# Patient Record
Sex: Female | Born: 1980 | Race: White | Hispanic: No | Marital: Married | Smoking: Never smoker
Health system: Southern US, Community
[De-identification: ages and names within clinical notes are randomized; demographics above are authoritative.]

## PROBLEM LIST (undated history)

## (undated) DIAGNOSIS — C50919 Malignant neoplasm of unspecified site of unspecified female breast: Secondary | ICD-10-CM

## (undated) HISTORY — PX: MASTECTOMY: SHX3

## (undated) HISTORY — PX: ABDOMINAL HYSTERECTOMY: SHX81

## (undated) HISTORY — PX: TONSILLECTOMY: SUR1361

---

## 2004-04-07 ENCOUNTER — Emergency Department: Payer: Self-pay | Admitting: Emergency Medicine

## 2005-03-11 ENCOUNTER — Emergency Department: Payer: Self-pay | Admitting: Emergency Medicine

## 2005-03-15 ENCOUNTER — Ambulatory Visit: Payer: Self-pay | Admitting: Emergency Medicine

## 2005-11-13 ENCOUNTER — Emergency Department: Payer: Self-pay | Admitting: Emergency Medicine

## 2006-01-16 ENCOUNTER — Emergency Department: Payer: Self-pay

## 2006-01-18 ENCOUNTER — Ambulatory Visit: Payer: Self-pay

## 2006-03-30 ENCOUNTER — Emergency Department: Payer: Self-pay | Admitting: Emergency Medicine

## 2007-02-24 ENCOUNTER — Emergency Department: Payer: Self-pay | Admitting: Emergency Medicine

## 2007-10-04 ENCOUNTER — Ambulatory Visit: Payer: Self-pay | Admitting: Obstetrics and Gynecology

## 2007-12-23 ENCOUNTER — Emergency Department: Payer: Self-pay | Admitting: Unknown Physician Specialty

## 2008-03-19 ENCOUNTER — Ambulatory Visit: Payer: Self-pay | Admitting: Obstetrics and Gynecology

## 2008-06-14 ENCOUNTER — Emergency Department: Payer: Self-pay | Admitting: Emergency Medicine

## 2008-11-25 ENCOUNTER — Ambulatory Visit: Payer: Self-pay | Admitting: Gastroenterology

## 2008-12-11 ENCOUNTER — Emergency Department: Payer: Self-pay | Admitting: Emergency Medicine

## 2009-03-31 ENCOUNTER — Emergency Department: Payer: Self-pay

## 2009-12-16 ENCOUNTER — Encounter: Payer: Self-pay | Admitting: Maternal & Fetal Medicine

## 2010-02-24 ENCOUNTER — Observation Stay: Payer: Self-pay | Admitting: Obstetrics and Gynecology

## 2010-03-23 ENCOUNTER — Observation Stay: Payer: Self-pay

## 2010-05-13 ENCOUNTER — Other Ambulatory Visit: Payer: Self-pay | Admitting: Obstetrics and Gynecology

## 2010-05-20 ENCOUNTER — Ambulatory Visit: Payer: Self-pay | Admitting: Obstetrics and Gynecology

## 2010-05-20 IMAGING — US ABDOMEN ULTRASOUND LIMITED
1 series · 17 of 25 positions shown · non-contrast
Comparison: none

REASON FOR EXAM: ATTN Gallbladder  back pain RUQ  worse after eating  pt
is [N4] preg
COMMENTS:

[Series 1: abdomen ultrasound limited · 17 of 75 slices shown]
[im 1/75]
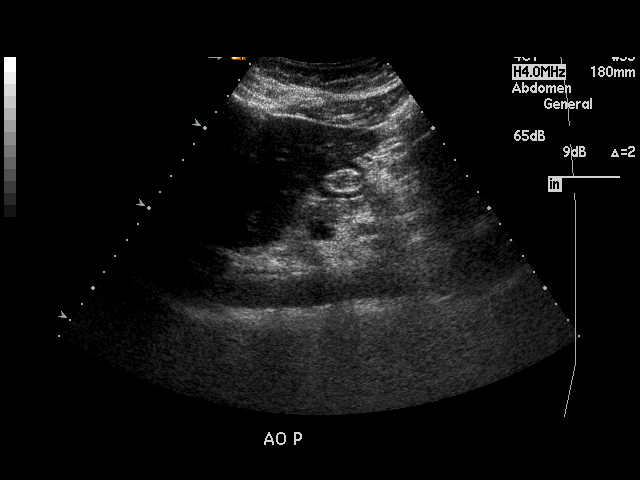
[im 7/75]
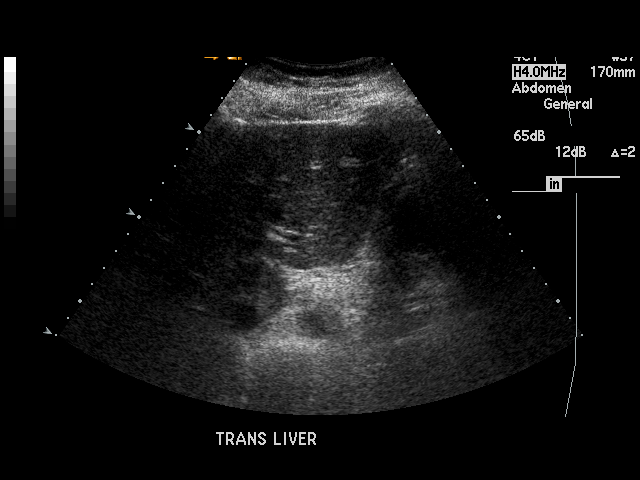
[im 10/75]
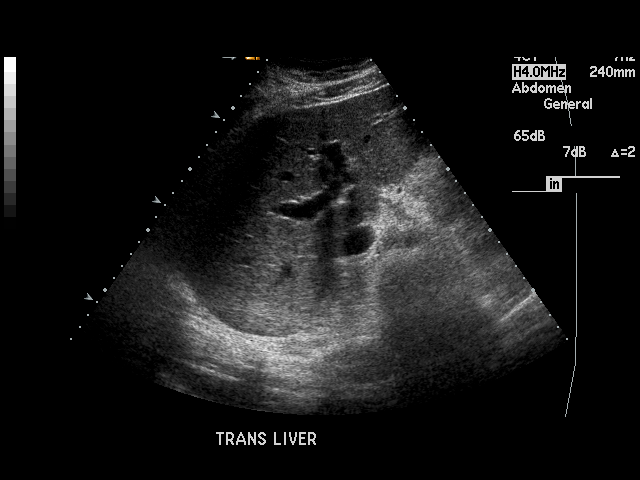
[im 16/75]
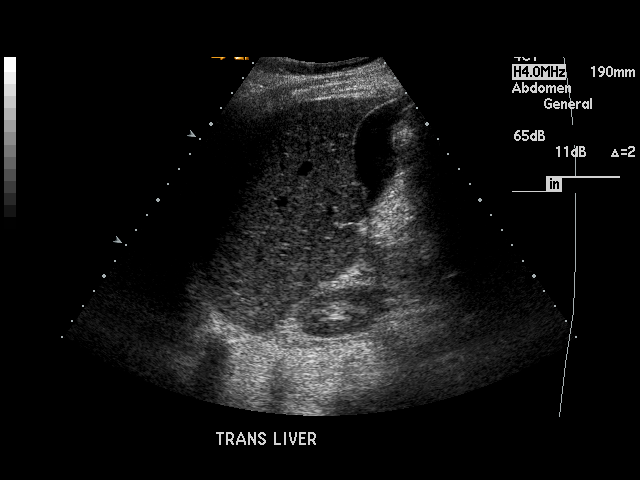
[im 19/75]
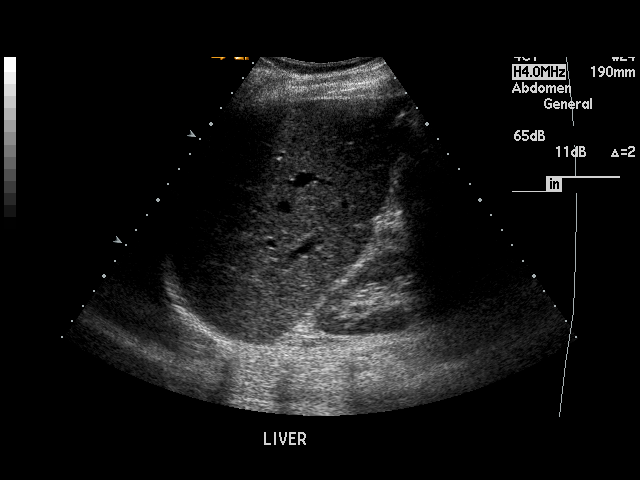
[im 25/75]
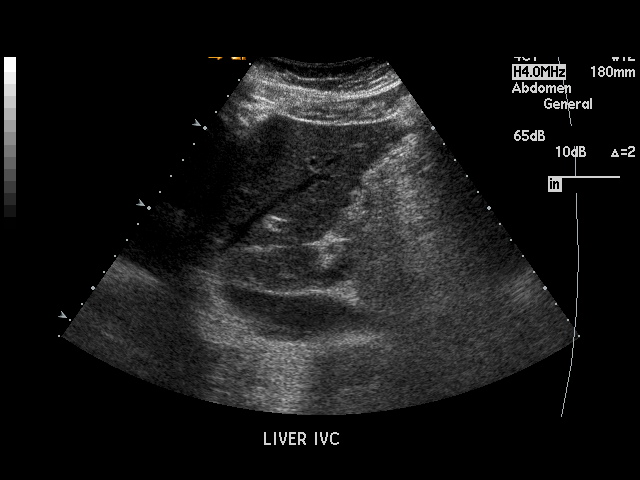
[im 28/75]
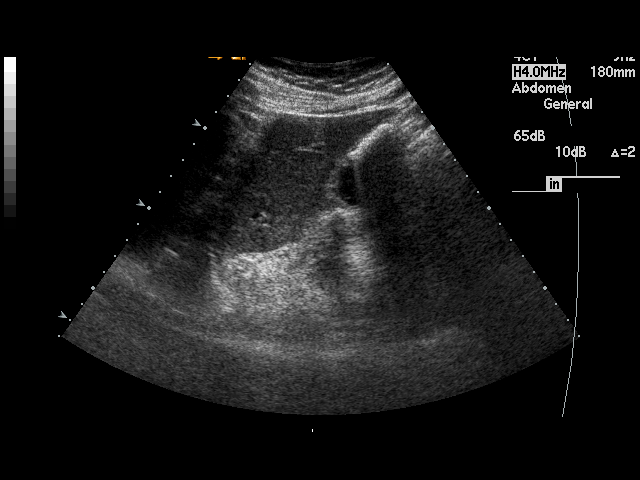
[im 34/75]
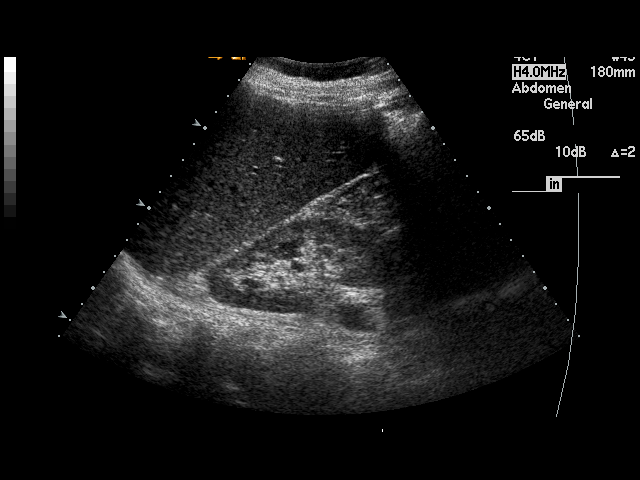
[im 38/75]
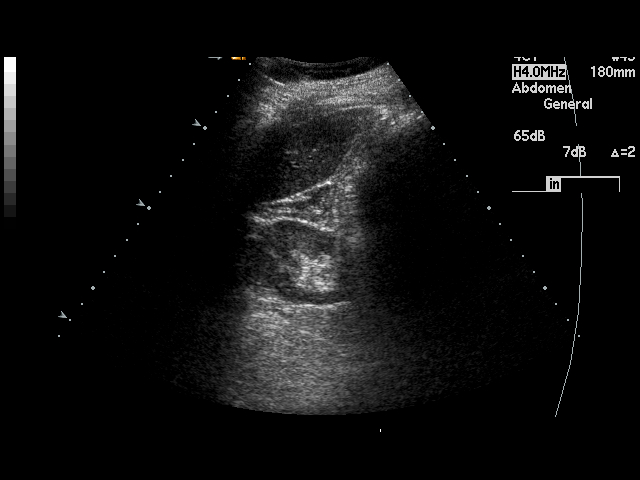
[im 41/75]
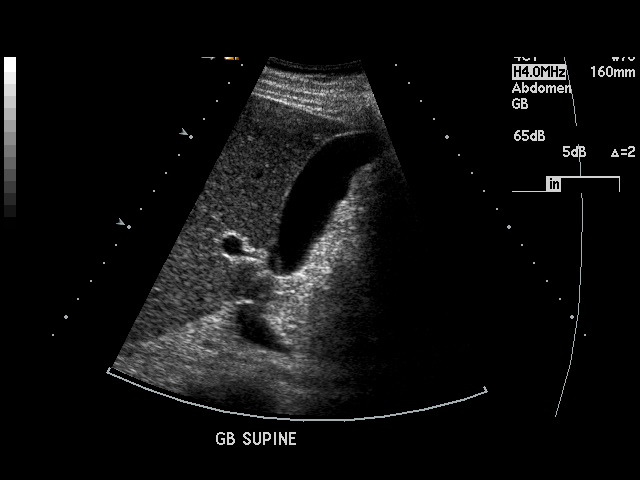
[im 47/75]
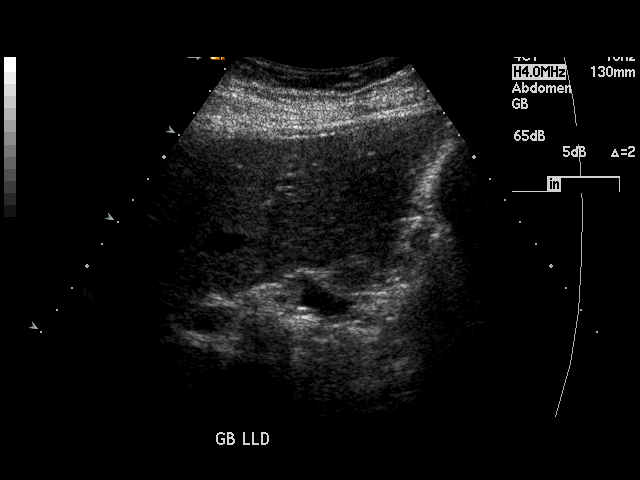
[im 50/75]
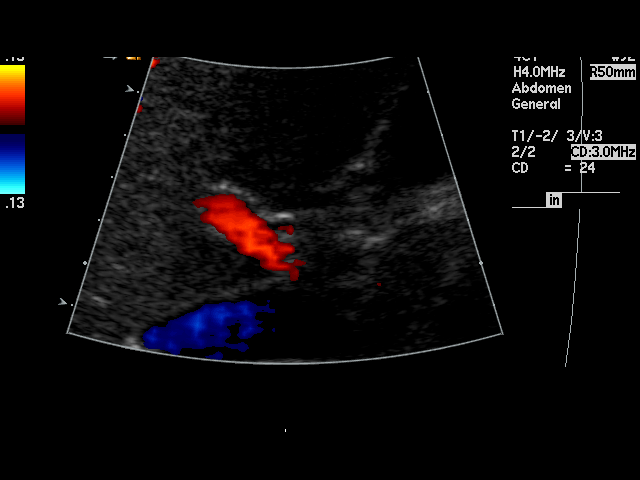
[im 56/75]
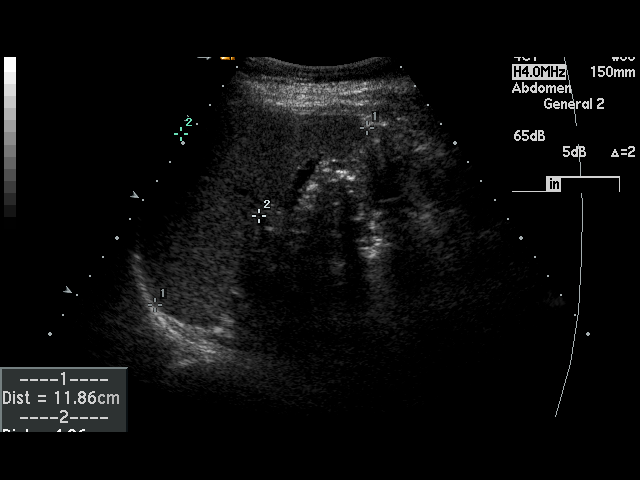
[im 59/75]
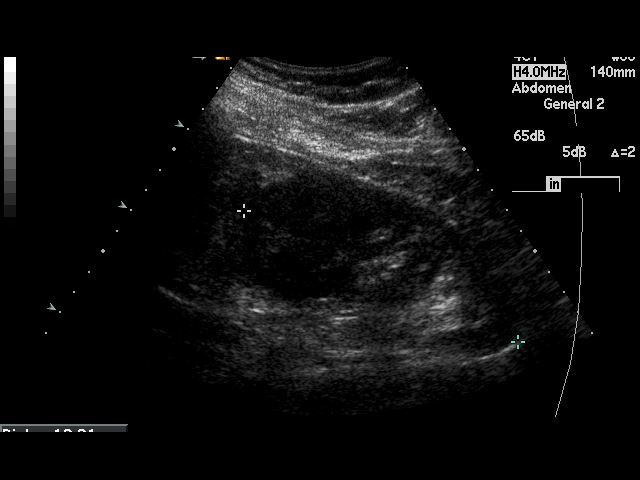
[im 65/75]
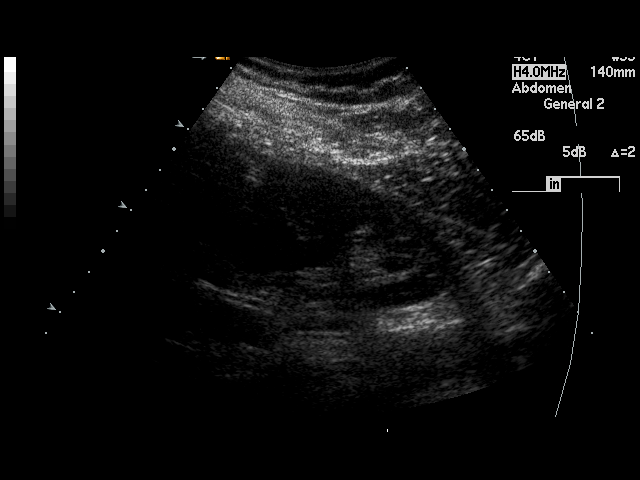
[im 68/75]
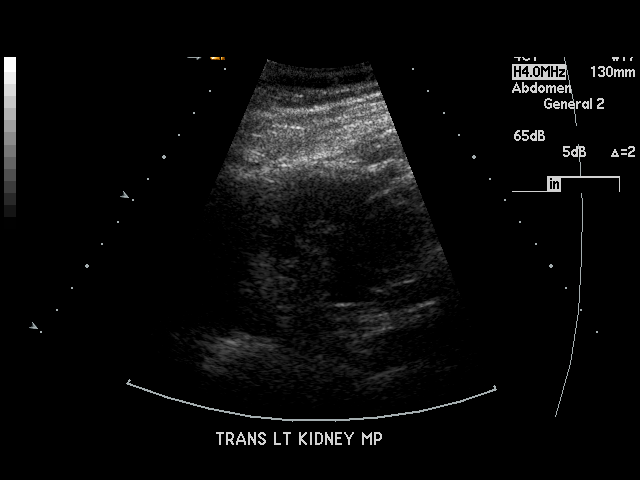
[im 75/75]
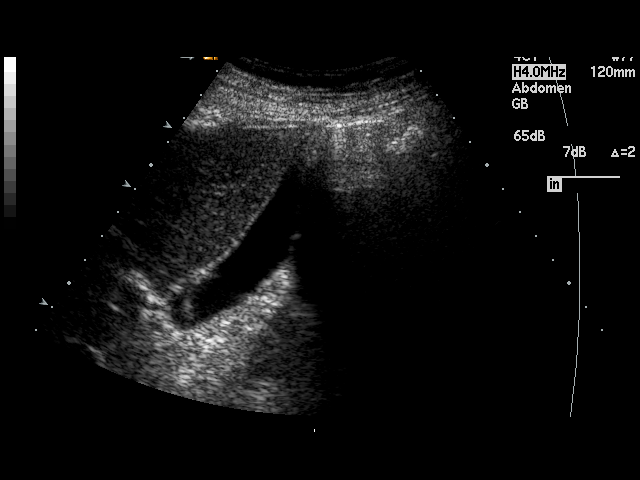

[17 of 25 positions shown; findings below may reference images not displayed]

PROCEDURE:     NAZARETH - NAZARETH ABDOMEN UPPER GENERAL  - [DATE]  [DATE]

RESULT:     Comparison: None.

Technique and Findings:
Multiple grayscale and color Doppler images were obtained of the abdomen.

The images are slightly degraded secondary to mass effect from the pregnant
uterus.  Visualized liver and spleen are unremarkable. The portal vein is
patent. The pancreas is mostly teared by bowel gas. No gallstones,
gallbladder wall thickening, or pericholecystic fluid. The sonographic
Murphy sign is negative. The common bile duct measures 4 mm.

Images of the kidneys show no hydronephrosis.
IMPRESSION: Normal gallbladder. No acute findings.

## 2010-06-23 ENCOUNTER — Observation Stay: Payer: Self-pay | Admitting: Obstetrics and Gynecology

## 2010-07-01 ENCOUNTER — Inpatient Hospital Stay: Payer: Self-pay | Admitting: Obstetrics and Gynecology

## 2012-11-27 ENCOUNTER — Emergency Department: Payer: Self-pay | Admitting: Emergency Medicine

## 2012-11-27 LAB — URINALYSIS, COMPLETE
Bilirubin,UR: NEGATIVE
Blood: NEGATIVE
Ketone: NEGATIVE
Nitrite: NEGATIVE
Ph: 6 (ref 4.5–8.0)
RBC,UR: 3 /HPF (ref 0–5)
Specific Gravity: 1.023 (ref 1.003–1.030)
WBC UR: 1 /HPF (ref 0–5)

## 2012-11-27 LAB — COMPREHENSIVE METABOLIC PANEL
Albumin: 3.6 g/dL (ref 3.4–5.0)
Alkaline Phosphatase: 83 U/L (ref 50–136)
Anion Gap: 4 — ABNORMAL LOW (ref 7–16)
BUN: 10 mg/dL (ref 7–18)
Chloride: 106 mmol/L (ref 98–107)
Co2: 31 mmol/L (ref 21–32)
Creatinine: 0.63 mg/dL (ref 0.60–1.30)
EGFR (African American): >60
EGFR (Non-African Amer.): >60
Osmolality: 280 (ref 275–301)
SGOT(AST): 20 U/L (ref 15–37)
SGPT (ALT): 17 U/L (ref 12–78)
Sodium: 141 mmol/L (ref 136–145)
Total Protein: 6.8 g/dL (ref 6.4–8.2)

## 2012-11-27 LAB — CBC
HCT: 36 % (ref 35.0–47.0)
MCH: 33.4 pg (ref 26.0–34.0)
MCHC: 35.3 g/dL (ref 32.0–36.0)
MCV: 95 fL (ref 80–100)
Platelet: 199 10*3/uL (ref 150–440)
RBC: 3.8 10*6/uL (ref 3.80–5.20)
WBC: 7 10*3/uL (ref 3.6–11.0)

## 2012-11-27 LAB — LIPASE, BLOOD: Lipase: 126 U/L (ref 73–393)

## 2013-07-07 ENCOUNTER — Emergency Department: Payer: Self-pay | Admitting: Emergency Medicine

## 2013-07-07 LAB — WET PREP, GENITAL

## 2013-07-07 LAB — CBC
HCT: 36.7 % (ref 35.0–47.0)
HGB: 12.5 g/dL (ref 12.0–16.0)
MCH: 32.1 pg (ref 26.0–34.0)
MCHC: 33.9 g/dL (ref 32.0–36.0)
MCV: 95 fL (ref 80–100)
Platelet: 200 10*3/uL (ref 150–440)
RBC: 3.88 10*6/uL (ref 3.80–5.20)
RDW: 12.7 % (ref 11.5–14.5)
WBC: 6.1 10*3/uL (ref 3.6–11.0)

## 2013-07-07 LAB — GC/CHLAMYDIA PROBE AMP

## 2013-07-07 LAB — HCG, QUANTITATIVE, PREGNANCY: BETA HCG, QUANT.: 115 m[IU]/mL — AB

## 2013-07-22 LAB — CBC WITH DIFFERENTIAL/PLATELET
Basophil #: 0 10*3/uL (ref 0.0–0.1)
Basophil %: 0.3 %
EOS ABS: 0 10*3/uL (ref 0.0–0.7)
EOS PCT: 0.2 %
HCT: 38.7 % (ref 35.0–47.0)
HGB: 13.1 g/dL (ref 12.0–16.0)
Lymphocyte #: 0.4 10*3/uL — ABNORMAL LOW (ref 1.0–3.6)
Lymphocyte %: 5.3 %
MCH: 31.9 pg (ref 26.0–34.0)
MCHC: 33.7 g/dL (ref 32.0–36.0)
MCV: 95 fL (ref 80–100)
Monocyte #: 0.4 x10 3/mm (ref 0.2–0.9)
Monocyte %: 4.5 %
NEUTROS ABS: 6.9 10*3/uL — AB (ref 1.4–6.5)
Neutrophil %: 89.7 %
PLATELETS: 213 10*3/uL (ref 150–440)
RBC: 4.09 10*6/uL (ref 3.80–5.20)
RDW: 12.8 % (ref 11.5–14.5)
WBC: 7.7 10*3/uL (ref 3.6–11.0)

## 2013-07-22 LAB — COMPREHENSIVE METABOLIC PANEL
ANION GAP: 5 — AB (ref 7–16)
AST: 17 U/L (ref 15–37)
Albumin: 3.8 g/dL (ref 3.4–5.0)
Alkaline Phosphatase: 73 U/L
BUN: 13 mg/dL (ref 7–18)
Bilirubin,Total: 0.9 mg/dL (ref 0.2–1.0)
Calcium, Total: 8.9 mg/dL (ref 8.5–10.1)
Chloride: 104 mmol/L (ref 98–107)
Co2: 27 mmol/L (ref 21–32)
Creatinine: 0.69 mg/dL (ref 0.60–1.30)
EGFR (African American): >60
EGFR (Non-African Amer.): >60
Glucose: 98 mg/dL (ref 65–99)
Osmolality: 272 (ref 275–301)
POTASSIUM: 4.2 mmol/L (ref 3.5–5.1)
SGPT (ALT): 18 U/L (ref 12–78)
SODIUM: 136 mmol/L (ref 136–145)
Total Protein: 7.2 g/dL (ref 6.4–8.2)

## 2013-07-22 LAB — URINALYSIS, COMPLETE
Bacteria: NONE SEEN
Bilirubin,UR: NEGATIVE
Blood: NEGATIVE
Glucose,UR: NEGATIVE mg/dL (ref 0–75)
Leukocyte Esterase: NEGATIVE
Nitrite: NEGATIVE
Ph: 5 (ref 4.5–8.0)
Protein: NEGATIVE
Specific Gravity: 1.026 (ref 1.003–1.030)
Squamous Epithelial: <1
WBC UR: NONE SEEN /HPF (ref 0–5)

## 2013-07-22 LAB — TROPONIN I: Troponin-I: <0.02 ng/mL

## 2013-07-22 LAB — PRO B NATRIURETIC PEPTIDE: B-TYPE NATIURETIC PEPTID: 18 pg/mL (ref 0–125)

## 2013-07-22 LAB — LIPASE, BLOOD: LIPASE: 94 U/L (ref 73–393)

## 2013-07-23 ENCOUNTER — Observation Stay: Payer: Self-pay | Admitting: Family Medicine

## 2013-07-23 LAB — CBC WITH DIFFERENTIAL/PLATELET
BASOS ABS: 0 10*3/uL (ref 0.0–0.1)
BASOS PCT: 0.1 %
Basophil #: 0 10*3/uL (ref 0.0–0.1)
Basophil %: 0.1 %
Eosinophil #: 0 10*3/uL (ref 0.0–0.7)
Eosinophil #: 0 10*3/uL (ref 0.0–0.7)
Eosinophil %: 0.1 %
Eosinophil %: 0.8 %
HCT: 36.6 % (ref 35.0–47.0)
HCT: 35.4 % (ref 35.0–47.0)
HGB: 11.9 g/dL — ABNORMAL LOW (ref 12.0–16.0)
HGB: 11.8 g/dL — ABNORMAL LOW (ref 12.0–16.0)
LYMPHS ABS: 0.7 10*3/uL — AB (ref 1.0–3.6)
Lymphocyte #: 0.5 10*3/uL — ABNORMAL LOW (ref 1.0–3.6)
Lymphocyte %: 8.8 %
Lymphocyte %: 16 %
MCH: 31 pg (ref 26.0–34.0)
MCH: 31.7 pg (ref 26.0–34.0)
MCHC: 32.6 g/dL (ref 32.0–36.0)
MCHC: 33.2 g/dL (ref 32.0–36.0)
MCV: 95 fL (ref 80–100)
MCV: 96 fL (ref 80–100)
MONOS PCT: 10.4 %
Monocyte #: 0.3 x10 3/mm (ref 0.2–0.9)
Monocyte #: 0.5 x10 3/mm (ref 0.2–0.9)
Monocyte %: 4.7 %
Neutrophil #: 5 10*3/uL (ref 1.4–6.5)
Neutrophil #: 3.2 10*3/uL (ref 1.4–6.5)
Neutrophil %: 86.3 %
Neutrophil %: 72.7 %
PLATELETS: 186 10*3/uL (ref 150–440)
Platelet: 167 10*3/uL (ref 150–440)
RBC: 3.84 10*6/uL (ref 3.80–5.20)
RBC: 3.71 10*6/uL — ABNORMAL LOW (ref 3.80–5.20)
RDW: 13 % (ref 11.5–14.5)
RDW: 13.2 % (ref 11.5–14.5)
WBC: 5.8 10*3/uL (ref 3.6–11.0)
WBC: 4.3 10*3/uL (ref 3.6–11.0)

## 2013-07-23 LAB — HCG, QUANTITATIVE, PREGNANCY

## 2013-07-23 LAB — TROPONIN I: Troponin-I: <0.02 ng/mL

## 2013-11-06 ENCOUNTER — Emergency Department: Payer: Self-pay | Admitting: Emergency Medicine

## 2013-11-06 LAB — CBC WITH DIFFERENTIAL/PLATELET
BASOS ABS: 0 10*3/uL (ref 0.0–0.1)
BASOS PCT: 0.3 %
EOS ABS: 0.1 10*3/uL (ref 0.0–0.7)
EOS PCT: 2.6 %
HCT: 36.1 % (ref 35.0–47.0)
HGB: 12 g/dL (ref 12.0–16.0)
Lymphocyte #: 1.5 10*3/uL (ref 1.0–3.6)
Lymphocyte %: 27.1 %
MCH: 31.9 pg (ref 26.0–34.0)
MCHC: 33.3 g/dL (ref 32.0–36.0)
MCV: 96 fL (ref 80–100)
Monocyte #: 0.6 x10 3/mm (ref 0.2–0.9)
Monocyte %: 11.1 %
Neutrophil #: 3.2 10*3/uL (ref 1.4–6.5)
Neutrophil %: 58.9 %
Platelet: 239 10*3/uL (ref 150–440)
RBC: 3.77 10*6/uL — ABNORMAL LOW (ref 3.80–5.20)
RDW: 12.8 % (ref 11.5–14.5)
WBC: 5.5 10*3/uL (ref 3.6–11.0)

## 2013-11-06 LAB — COMPREHENSIVE METABOLIC PANEL
Albumin: 3.2 g/dL — ABNORMAL LOW (ref 3.4–5.0)
Alkaline Phosphatase: 68 U/L
Anion Gap: 6 — ABNORMAL LOW (ref 7–16)
BILIRUBIN TOTAL: 0.4 mg/dL (ref 0.2–1.0)
BUN: 11 mg/dL (ref 7–18)
CREATININE: 0.73 mg/dL (ref 0.60–1.30)
Calcium, Total: 8.2 mg/dL — ABNORMAL LOW (ref 8.5–10.1)
Chloride: 108 mmol/L — ABNORMAL HIGH (ref 98–107)
Co2: 27 mmol/L (ref 21–32)
Glucose: 96 mg/dL (ref 65–99)
Osmolality: 281 (ref 275–301)
POTASSIUM: 3.9 mmol/L (ref 3.5–5.1)
SGOT(AST): 21 U/L (ref 15–37)
SGPT (ALT): 15 U/L
SODIUM: 141 mmol/L (ref 136–145)
TOTAL PROTEIN: 6.8 g/dL (ref 6.4–8.2)

## 2013-11-06 LAB — TROPONIN I

## 2014-06-09 DIAGNOSIS — F419 Anxiety disorder, unspecified: Secondary | ICD-10-CM | POA: Insufficient documentation

## 2014-06-09 DIAGNOSIS — G44209 Tension-type headache, unspecified, not intractable: Secondary | ICD-10-CM | POA: Insufficient documentation

## 2014-07-06 ENCOUNTER — Emergency Department: Payer: Self-pay | Admitting: Emergency Medicine

## 2014-08-01 NOTE — Consult Note (Signed)
Pt seen and examined. Please see Dawn Harrison's note. Previous hx of PUD in 2010. Recent bout of nausea/UGI sxs in March, when she was pregnant. Had miscarriage afterwards. Now with nausea/vomiting and possible hematemesis. Takes NSAIDS/BC powder. No vomiting today. Plan EGD this afternoon. thanks  Electronic Signatures: Verdie Shire (MD)  (Signed on 15-Apr-15 12:53)  Authored  Last Updated: 15-Apr-15 12:53 by Verdie Shire (MD)

## 2014-08-01 NOTE — H&P (Signed)
PATIENT NAME:  Carly Davis, Carly Davis MR#:  [id number] DATE OF BIRTH:  11/06/1980  DATE OF ADMISSION:  07/23/2013  REFERRING PHYSICIAN: Dr. Cinda Quest  PRIMARY CARE PHYSICIAN: Dr. Hardin Negus   CHIEF COMPLAINT: Abdominal pain.   HISTORY OF PRESENT ILLNESS: A 34 year old Caucasian female with history of peptic ulcer disease, stating for the last five years, presenting with abdominal pain. She describes one day duration of abdominal pain, epigastric in location, sharp in quality, 8 out of 10 in intensity, nonradiating, no worsening or relieving factors. Associated with nausea and vomiting. She had multiple bouts of emesis. Her most recent bout included bright red blood described as "chunks." Despite this, she denies any chest pain, palpitations, shortness of breath, lightheadedness, further symptomatology. Currently without complaints.   REVIEW OF SYSTEMS:  CONSTITUTIONAL: Denies fever, fatigue, weakness.  EYES: Denies blurred vision, double vision, eye pain.  ENT:  No tinnitus, ear pain, hearing loss. RESPIRATORY: Denies cough, wheeze, shortness of breath.  CARDIOVASCULAR: Denies chest pain, palpitations, edema.  GASTROINTESTINAL: Positive for nausea, vomiting, abdominal pain, hematemesis, as described above.  GENITOURINARY: Denies dysuria, hematuria.  ENDOCRINE: Denies nocturia or thyroid problems.  HEMATOLOGIC AND LYMPHATIC: Denies easy bruising, bleeding. Positive as described above.  SKIN: Denies rash or lesion.  MUSCULOSKELETAL: Denies pain in neck, back, shoulders, knees, hips or arthritic symptoms.  NEUROLOGIC: Denies paralysis, paresthesias.  PSYCHIATRIC: Denies anxiety or depressive symptoms.  Otherwise, full review of systems performed by me is negative.   PAST MEDICAL HISTORY: Peptic ulcer disease and H. pylori, treated approximately five years ago.   SOCIAL HISTORY: Denies alcohol, tobacco or drug usage. Uses in minimal caffeine. Uses one caffeinated beverage daily.   FAMILY HISTORY:  Positive for heart disease, diabetes, and peptic ulcer disease as well.   ALLERGIES: AMOXICILLIN, PENICILLIN.   HOME MEDICATIONS: Include Zantac 150 mg p.o. b.i.d., esomeprazole 40 mg p.o. daily.   PHYSICAL EXAMINATION: VITAL SIGNS: Temperature 98.1, heart rate 89, respirations 18, blood pressure 105/64, oxygen saturation 96% on room air. Weight 72.6 kg, BMI 26.6.  GENERAL: Well-nourished, well-developed, Caucasian female, in no acute distress. HEENT: Normocephalic and atraumatic.  EYES: Pupils equal, round, reactive to light. Extraocular muscles are intact. No scleral icterus. MOUTH: Moist mucosal membrane. Dentition intact. No abscess noted.  EARS, NOSE, AND THROAT: Throat clear without exudates. No external lesions.  NECK: Supple. No thyromegaly. No nodules. No JVD.  PULMONARY: Clear to auscultation bilaterally without wheezes, rubs, or rhonchi. No accessory muscle use. Good respiratory effort.  CHEST: Nontender to palpation.  CARDIOVASCULAR: S1, S2. Regular rate and rhythm. No murmurs, rubs or gallops. No edema. Pedal pulses 2+ bilaterally.  GASTROINTESTINAL: Soft. Minimal tenderness over epigastric region, without rebound, guarding, or motion tenderness. Nondistended. No masses. Positive bowel sounds. No hepatosplenomegaly.  MUSCULOSKELETAL: No swelling, clubbing, edema. Range of motion full in all extremities.  NEUROLOGIC: Cranial nerves II through XII intact. No gross focal neurological deficits. Sensation intact. Reflexes intact.  SKIN: No ulcerations, lesions, rash, cyanosis. Skin warm, dry. Turgor intact.  PSYCHIATRIC: Mood and affect within normal limits. Patient awake, alert, oriented x3. Insight and judgment intact.   LABORATORY DATA: EKG performed with normal sinus rhythm; however, there are anterolateral T inversions without ST segment changes.   LABORATORY DATA: Sodium 136, potassium 4.2, chloride 104, bicarbonate 27, BUN 13, creatinine 0.69, glucose 98. LFTs within normal  limits. Troponin I less than 0.02. WBC 7.7, hemoglobin 13.1, platelets of 213. Urinalysis negative for evidence of infection. She had an abdominal x-ray performed which reveals no acute  findings.   ASSESSMENT AND PLAN: A 34 year old Caucasian female with history of peptic ulcer disease, presenting with abdominal pain and hematemesis.   1.  Hematemesis. She will be placed on Protonix, started in the Emergency Department. We will continue to trend CBCs q.6 hours. Type and cross. We will consult gastroenterology.  2.  T inversions noted on EKG changes. No prior EKGs to compare. We will place on telemetry, trend cardiac enzymes x3, though no risk factors for heart disease.  3.  Deep venous thrombosis prophylaxis with sequential compression devices, as she has bleeding.   full code TIME SPENT: 45 minutes.    ____________________________ Aaron Mose. Beya Tipps, MD dkh:cg D: 07/23/2013 01:25:22 ET T: 07/23/2013 01:49:36 ET JOB#: [id number]  cc: Aaron Mose. Prescilla Monger, MD, <Dictator> Assad Harbeson Woodfin Ganja MD ELECTRONICALLY SIGNED 07/30/2013 20:35

## 2014-08-01 NOTE — Consult Note (Signed)
Brief Consult Note: Diagnosis: Abdominal pain, epigastric as well as left upper quadrant.  Associated nausea, vomiting and hematemesis.  Known history of PUD and H. Pylori.  Regular NSAID use as well as BC powder.   Consult note dictated.   Recommend to proceed with surgery or procedure.   Discussed with Attending MD.   Comments: Patient's presentation discussed with Dr. Verdie Shire and recommendation is to proceed with EGD this afternoon to allow direct luminal evalaution.  Evaluate for recurrent ulcer disease.  Increase risk with regular NSAID use.  Order already placed by Dr. Candace Cruise. Procedure, risk versus benefits discussed with patient. Will continue to monitor.  Continue with current medication therapy as ordered inclusive of PPI therapy.  Electronic Signatures: Payton Emerald (NP)  (Signed 15-Apr-15 12:20)  Authored: Brief Consult Note   Last Updated: 15-Apr-15 12:20 by Payton Emerald (NP)

## 2014-08-01 NOTE — Consult Note (Signed)
PATIENT NAME:  CAITLYN, BUCHANAN MR#:  [id number] DATE OF BIRTH:  [birth year]  DATE OF CONSULTATION:  07/23/2013  REFERRING PHYSICIAN:  Aaron Mose. Hower, MD CONSULTING PHYSICIAN: [facility], Payton Emerald, NP, Lupita Dawn. Candace Cruise, MD  PRIMARY PHYSICIAN: Morton Peters., MD  REASON FOR CONSULTATION: Hematemesis. Known history of peptic ulcer disease.   HISTORY OF PRESENT ILLNESS: Ms. Chamblin is a very pleasant 34 year old Caucasian female with a known history of peptic ulcer disease as well as treated for Helicobacter pylori 5 years ago under the care of Dr. Dionne Milo. She had an upper endoscopy done at that time which revealed ulcers involving esophageal area, duodenal and gastric body. She said she was treated with antibiotic therapy at that time, but questionable whether stool study was done afterwards to make sure that infection was eradicated.   She has a known history of recurrent persistent headaches for which she was taking BC Powders on a more regular basis, last one that she took was [place], but she does take at least 5 days a week ibuprofen at least 400 mg a day. She started to experience nausea, vomiting, epigastric, left upper quadrant abdominal pain which was persistent in March of this year. She presented to [facility] Emergency Room at that time, diagnosed with dehydration and peptic ulcer disease, and ironically, was told that she was pregnant at that time. She was unaware of that. She was treated with Protonix 40 mg a day, given GI cocktail and was advised to follow up with GI service at [facility]. Endoscopy evaluation was not recommended at that point given her pregnancy status. She unfortunately miscarried on March 30th, and since her miscarriage, she has gone back to taking ibuprofen on a regular basis. The patient states yesterday she started to experience nausea again, dry heaving, vomiting, vomited up slight pinkish amount of blood yesterday afternoon. She has not vomited since that time. Severe abdominal  pain, epigastric or left upper quadrant, radiating through to her back. Denies being a sharp pain, but just a very achy annoying pain that is there, which she has not been able to get completely to resolve, though her symptoms are better since being hospitalized. No weight loss. Last bowel movement was yesterday. No chest pain. No shortness of breath. Other than what is stated, GI review of systems is unremarkable.   REVIEW OF SYSTEMS:  CONSTITUTIONAL: Denies any fevers, fatigue, weakness.  EYES: No blurred vision, double vision.  ENT: No tinnitus, ear pain, hearing loss.  RESPIRATORY: No coughing, no wheezing.  CARDIOVASCULAR: No chest pain, no heart palpitations.  GASTROINTESTINAL: See HPI.  GENITOURINARY: No dysuria or hematuria.  ENDOCRINE: No nocturia or thyroid problems.  HEMATOLOGIC AND LYMPHATIC: Denies easy bruising and bleeding other than what is noted above.  SKIN: No lesions. No rashes.  MUSCULOSKELETAL: No neuralgias. No myalgias.  NEUROLOGICAL: No history of CVA or seizure disorder.  PSYCHIATRIC: No depression. No anxiety.   PAST MEDICAL HISTORY: Headaches, peptic ulcer disease, Helicobacter pylori.   PAST SURGICAL HISTORY: Tonsillectomy, right foot surgery.   SOCIAL HISTORY: No tobacco. No alcohol use. Married, 2 children. Employed, working a Physiological scientist. Minimal caffeine use.   FAMILY HISTORY: Aunt, maternal, lung cancer. No history of colon cancer, other GI neoplasms. Mom, Dad, history of peptic ulcer disease. No family history of IBD, celiac disease.   ALLERGIES: AMOXICILLIN AND PENICILLIN.   HOME MEDICATIONS: Ibuprofen as directed as needed, Tylenol as directed as needed. No PPI therapy for the past 2 weeks.  PHYSICAL EXAMINATION:  VITAL SIGNS: Temperature is 97.7 with a pulse of 68, respirations are 19, blood pressure is 92/60 with a pulse oximetry of 97% on room air.  HEENT: Normocephalic, atraumatic. Pupils equally reactive to light. Conjunctivae clear.  Sclerae anicteric.  NECK: Supple. Trachea midline. No lymphadenopathy or thyromegaly.  PULMONARY: Symmetric rise and fall of chest. Clear to auscultation throughout.  CARDIOVASCULAR: Regular rhythm, S1, S2. No murmurs, no gallops.  ABDOMEN: Soft, nondistended. Bowel sounds in 4 quadrants. No bruits. No masses, evidence of hepatosplenomegaly. Mild discomfort noted, epigastric, more localized to the left upper quadrant.  RECTAL: Deferred.  MUSCULOSKELETAL: Moving all 4 extremities. No contractures. No clubbing.  EXTREMITIES: No edema.  NEUROLOGICAL: No gross neurological deficits.  PSYCHIATRIC: Alert and oriented x4. Memory grossly intact.  SKIN: Warm, dry. No lesions. No rashes.   LABORATORY AND DIAGNOSTIC DATA: Chemistry panel within normal limits except anion gap is low at 5. Urine pregnancy test is less than 1 serum. Hepatic panel within normal limits. Troponin level x2 was less than 0.02. CBC on admission within normal limits except differential revealed elevation in neutrophil number at 6.9 with lymphocyte of 0.4. Hemoglobin was 13.1 with hematocrit of 38.7. In trending, it did drop to 11.9 with hematocrit of 36.6 at 2:54 in the morning today's date, and at 9:00 this morning, hemoglobin was 11.8. Urinalysis essentially unremarkable. Three-way of abdomen inclusive of chest: Relatively large colonic stool burden, but otherwise unremarkable.   IMPRESSION: Severe abdominal pain, epigastric, left upper quadrant, hematemesis, nausea, vomiting, known history of peptic ulcer disease and x-ray revealing moderate stool burden. Regular NSAID use as well as BC Powder use.   PLAN: The patient's presentation discussed with Dr. Verdie Shire. Recommendation is to proceed with an upper endoscopy today to allow direct luminal evaluation, evaluate for evidence of ulcer disease as well as biopsies to make sure that there is not recurrence of Helicobacter pylori. Procedure risks versus benefits discussed with the patient  and her husband, who is present. Order already placed by Dr. Candace Cruise. Will continue to monitor. The patient to continue with PPI therapy at this time as ordered. Strongly encourage the patient and had a long discussion about avoidance of NSAID use and BC Powder use given her GI history.   These services provided by Payton Emerald, MS, APRN, [facility], FNP, under collaborative agreement with Dr. Verdie Shire.   ____________________________ Payton Emerald, NP dsh:lb D: 07/23/2013 12:17:04 ET T: 07/23/2013 12:55:18 ET JOB#: [id number]  cc: Payton Emerald, NP, <Dictator> Payton Emerald MD ELECTRONICALLY SIGNED 07/23/2013 16:49

## 2014-08-01 NOTE — Consult Note (Signed)
EGD was completely normal. No signs of bleeding anywhere. For now, start daily PPI.  If symptoms persist, consider GB u/s even though LFT normal on admission. Thanks.   Electronic Signatures: Verdie Shire (MD) (Signed on 15-Apr-15 13:43)  Authored   Last Updated: 15-Apr-15 13:44 by Verdie Shire (MD)

## 2014-08-01 NOTE — Discharge Summary (Signed)
PATIENT NAME:  Carly Davis, STRATHMAN MR#:  [id number] DATE OF BIRTH:  02/23/1981  DATE OF ADMISSION:  07/23/2013 DATE OF DISCHARGE:  07/23/2013  REASON FOR ADMISSION: Abdominal pain.  HOSPITAL COURSE: This is a very nice 34 year old female with history of peptic ulcer disease in the past with occasional abdominal pain for the past 5 years with occasional diarrhea and occasional constipation on and off. She developed new abdominal pain, lasting for 1 day, occurred in the epigastric area, quality very sharp, very intense and severe about 8/10, nonradiating. Had significant amount of nausea and vomiting, multiple bouts of emesis with bright red blood describing as chunks prior to being admitted. The patient was admitted with a diagnosis of abdominal pain and possible upper GI bleed bleeding. The patient was started on Protonix and CBCs q. 6 hours were done. The patient was typed and crossed and a gastroenterology consult was placed. On top of that, the patient had T wave inversions noted on the EKG without a previous EKG to compare. Cardiac enzymes were done, and they were negative. The patient did not have any significant risk factors from heart disease. The patient was seen by Dr. Candace Cruise, who did an upper GI endoscopy showing normal esophagus, normal stomach, normal duodenum. When the patient had full resolution of the pain, and wanted to be discharged. Dr. Candace Cruise recommended possible follow up to evaluate gallbladder disease. The patient had normal. LFTs during this hospitalization but if the problem persists we would consider to do a HIDA scan.   The patient told me that she actually is going through significant stress and that she just had a miscarriage within the past week and she was followed by gynecology for this but she did not require any medications or procedures for the miscarriage. Her hCG is less than 1 or undetectable. Again her LFTs were normal with an AST of 17, ALT 18, alkaline phosphatase of 17, and  bilirubin of 0.9 After the upper endoscopy, since the patient was feeling so great, she asked to be discharged. We gave her recommendations about followup with primary care physician, followup with Dr. Candace Cruise. Dr. Candace Cruise recommended to continue Protonix. A prescription for Protonix 40 mg once a day was sent to the pharmacy. That is the only medication the patient is taking. Please followup with your primary care physician, Dr. Laurian Brim.   TIME SPENT: I spent about 40 minutes seeing and discharging this patient on the day of discharge.  Her hemoglobin was 13.1 on admission, 11.8 at discharge after IV fluids, some dilution effect.    ____________________________ [place] Sink, MD rsg:lt D: 07/27/2013 03:46:00 ET T: 07/27/2013 05:53:36 ET JOB#: [id number]  cc: [place] Sink, MD, <Dictator> Aminat [place] America Brown MD ELECTRONICALLY SIGNED 08/04/2013 14:56

## 2015-05-16 ENCOUNTER — Other Ambulatory Visit: Payer: Self-pay

## 2015-05-16 ENCOUNTER — Emergency Department: Payer: Medicaid Other

## 2015-05-16 ENCOUNTER — Encounter: Payer: Self-pay | Admitting: Emergency Medicine

## 2015-05-16 ENCOUNTER — Emergency Department
Admission: EM | Admit: 2015-05-16 | Discharge: 2015-05-16 | Disposition: A | Discharge status: Home / Self Care | Payer: Medicaid Other | Attending: Emergency Medicine | Admitting: Emergency Medicine

## 2015-05-16 DIAGNOSIS — J069 Acute upper respiratory infection, unspecified: Secondary | ICD-10-CM | POA: Insufficient documentation

## 2015-05-16 DIAGNOSIS — Z880 Allergy status to penicillin: Secondary | ICD-10-CM | POA: Diagnosis not present

## 2015-05-16 DIAGNOSIS — R05 Cough: Secondary | ICD-10-CM | POA: Diagnosis present

## 2015-05-16 MED ORDER — BENZONATATE 100 MG PO CAPS: 100.0000 mg | ORAL_CAPSULE | Freq: Three times a day (TID) | ORAL | Status: AC | PRN

## 2015-05-16 NOTE — Discharge Instructions (Signed)
Upper Respiratory Infection, Adult Most upper respiratory infections (URIs) are a viral infection of the air passages leading to the lungs. A URI affects the nose, throat, and upper air passages. The most common type of URI is nasopharyngitis and is typically referred to as "the common cold." URIs run their course and usually go away on their own. Most of the time, a URI does not require medical attention, but sometimes a bacterial infection in the upper airways can follow a viral infection. This is called a secondary infection. Sinus and middle ear infections are common types of secondary upper respiratory infections. Bacterial pneumonia can also complicate a URI. A URI can worsen asthma and chronic obstructive pulmonary disease (COPD). Sometimes, these complications can require emergency medical care and may be life threatening.  CAUSES Almost all URIs are caused by viruses. A virus is a type of germ and can spread from one person to another.  RISKS FACTORS You may be at risk for a URI if:   You smoke.   You have chronic heart or lung disease.  You have a weakened defense (immune) system.   You are very young or very old.   You have nasal allergies or asthma.  You work in crowded or poorly ventilated areas.  You work in health care facilities or schools. SIGNS AND SYMPTOMS  Symptoms typically develop 2-3 days after you come in contact with a cold virus. Most viral URIs last 7-10 days. However, viral URIs from the influenza virus (flu virus) can last 14-18 days and are typically more severe. Symptoms may include:   Runny or stuffy (congested) nose.   Sneezing.   Cough.   Sore throat.   Headache.   Fatigue.   Fever.   Loss of appetite.   Pain in your forehead, behind your eyes, and over your cheekbones (sinus pain).  Muscle aches.  DIAGNOSIS  Your health care provider may diagnose a URI by:  Physical exam.  Tests to check that your symptoms are not due to  another condition such as:  Strep throat.  Sinusitis.  Pneumonia.  Asthma. TREATMENT  A URI goes away on its own with time. It cannot be cured with medicines, but medicines may be prescribed or recommended to relieve symptoms. Medicines may help:  Reduce your fever.  Reduce your cough.  Relieve nasal congestion. HOME CARE INSTRUCTIONS   Take medicines only as directed by your health care provider.   Gargle warm saltwater or take cough drops to comfort your throat as directed by your health care provider.  Use a warm mist humidifier or inhale steam from a shower to increase air moisture. This may make it easier to breathe.  Drink enough fluid to keep your urine clear or pale yellow.   Eat soups and other clear broths and maintain good nutrition.   Rest as needed.   Return to work when your temperature has returned to normal or as your health care provider advises. You may need to stay home longer to avoid infecting others. You can also use a face mask and careful hand washing to prevent spread of the virus.  Increase the usage of your inhaler if you have asthma.   Do not use any tobacco products, including cigarettes, chewing tobacco, or electronic cigarettes. If you need help quitting, ask your health care provider. PREVENTION  The best way to protect yourself from getting a cold is to practice good hygiene.   Avoid oral or hand contact with people with cold   symptoms.   Wash your hands often if contact occurs.  There is no clear evidence that vitamin C, vitamin E, echinacea, or exercise reduces the chance of developing a cold. However, it is always recommended to get plenty of rest, exercise, and practice good nutrition.  SEEK MEDICAL CARE IF:   You are getting worse rather than better.   Your symptoms are not controlled by medicine.   You have chills.  You have worsening shortness of breath.  You have brown or red mucus.  You have yellow or brown nasal  discharge.  You have pain in your face, especially when you bend forward.  You have a fever.  You have swollen neck glands.  You have pain while swallowing.  You have white areas in the back of your throat. SEEK IMMEDIATE MEDICAL CARE IF:   You have severe or persistent:  Headache.  Ear pain.  Sinus pain.  Chest pain.  You have chronic lung disease and any of the following:  Wheezing.  Prolonged cough.  Coughing up blood.  A change in your usual mucus.  You have a stiff neck.  You have changes in your:  Vision.  Hearing.  Thinking.  Mood. MAKE SURE YOU:   Understand these instructions.  Will watch your condition.  Will get help right away if you are not doing well or get worse.   This information is not intended to replace advice given to you by your health care provider. Make sure you discuss any questions you have with your health care provider.   Document Released: 09/20/2000 Document Revised: 08/11/2014 Document Reviewed: 07/02/2013 Elsevier Interactive Patient Education 2016 [employer]  

## 2015-05-16 NOTE — ED Notes (Signed)
Pt checked in with her 2 sons who are also checked in to be patients. Pt c/o tightness in her chest, dry cough noted in triage. Pt states intermittent SOB at this time. Pt has been exposed to both sons who both have had fever and cough. Pt presents able to speak in complete sentences at this time. NAD noted.

## 2015-05-16 NOTE — ED Provider Notes (Signed)
CSN: [id number]     Arrival date & time 05/16/15  1017 History   None    Chief Complaint  Patient presents with  . Chest Pain  . Shortness of Breath  . Cough    HPI Comments: 35 year old female presents today complaining of cough productive of green sputum and chest tightness for the past 2 days. Her sons have both been sick with similar symptoms. No fevers, sweats or chills. Has not taken any over the counter medications for the same.   The history is provided by the patient.    History reviewed. No pertinent past medical history. Past Surgical History  Procedure Laterality Date  . Tonsillectomy     History reviewed. No pertinent family history. Social History  Substance Use Topics  . Smoking status: Never Smoker   . Smokeless tobacco: Never Used  . Alcohol Use: No   OB History    No data available     Review of Systems  Constitutional: Negative for fever and chills.  HENT: Positive for rhinorrhea.   Respiratory: Positive for cough and chest tightness.   Musculoskeletal: Negative for myalgias and arthralgias.  Skin: Negative for rash.  All other systems reviewed and are negative.     Allergies  Amoxicillin and Penicillins  Home Medications   Prior to Admission medications   Medication Sig Start Date End Date Taking? Authorizing Provider  benzonatate (TESSALON PERLES) 100 MG capsule Take 1 capsule (100 mg total) by mouth 3 (three) times daily as needed for cough. 05/16/15 05/15/16  Harvest Dark, PA-C   BP 114/77 mmHg  Pulse 81  Temp(Src) 97.8 F (36.6 C) (Oral)  Resp 18  Ht 5\' 5"  (1.651 m)  Wt 74.844 kg  BMI 27.46 kg/m2  SpO2 97%  LMP 05/07/2015 (Within Days) Physical Exam  Constitutional: She is oriented to person, place, and time. Vital signs are normal. She appears well-developed and well-nourished. She is active.  Non-toxic appearance. She does not have a sickly appearance. She does not appear ill.  HENT:  Head: Normocephalic and atraumatic.  Right  Ear: External ear normal.  Left Ear: External ear normal.  Nose: Rhinorrhea present.  Mouth/Throat: Uvula is midline, oropharynx is clear and moist and mucous membranes are normal.  Eyes: Conjunctivae and EOM are normal. Pupils are equal, round, and reactive to light.  Neck: Normal range of motion. Neck supple.  Cardiovascular: Normal rate, regular rhythm, normal heart sounds and intact distal pulses.  Exam reveals no gallop and no friction rub.   No murmur heard. Pulmonary/Chest: Effort normal and breath sounds normal. No respiratory distress. She has no wheezes. She has no rales.  Musculoskeletal: Normal range of motion.  Lymphadenopathy:    She has no cervical adenopathy.  Neurological: She is alert and oriented to person, place, and time.  Skin: Skin is warm and dry.  Psychiatric: She has a normal mood and affect. Her behavior is normal. Judgment and thought content normal.  Nursing note and vitals reviewed.   ED Course  Procedures (including critical care time) Labs Review Labs Reviewed - No data to display  Imaging Review No results found. I have personally reviewed and evaluated these images and lab results as part of my medical decision-making. EKG: normal EKG, normal sinus rhythm, interpreted by Physician in MUSE.   EKG Interpretation None      I independently reviewed and interpreted CXR and there is no evidence of acute cardiopulmonary disease  MDM  Push fluids Mucinex over  the counter  Tessalon perles as needed Follow up with PCP if no improvement in next 3-5 days or acutely worsening  Final diagnoses:  Viral URI        Harvest Dark, PA-C 05/16/15 1130  Carrie Mew, MD 05/16/15 1520

## 2015-11-24 DIAGNOSIS — C50211 Malignant neoplasm of upper-inner quadrant of right female breast: Secondary | ICD-10-CM | POA: Insufficient documentation

## 2015-12-08 ENCOUNTER — Emergency Department
Admission: EM | Admit: 2015-12-08 | Discharge: 2015-12-08 | Disposition: A | Discharge status: Home / Self Care | Payer: Medicaid Other | Attending: Emergency Medicine | Admitting: Emergency Medicine

## 2015-12-08 ENCOUNTER — Encounter: Payer: Self-pay | Admitting: Emergency Medicine

## 2015-12-08 DIAGNOSIS — M542 Cervicalgia: Secondary | ICD-10-CM | POA: Insufficient documentation

## 2015-12-08 DIAGNOSIS — Z853 Personal history of malignant neoplasm of breast: Secondary | ICD-10-CM | POA: Diagnosis not present

## 2015-12-08 DIAGNOSIS — R252 Cramp and spasm: Secondary | ICD-10-CM

## 2015-12-08 HISTORY — DX: Malignant neoplasm of unspecified site of unspecified female breast: C50.919

## 2015-12-08 LAB — CBC WITH DIFFERENTIAL/PLATELET
BASOS PCT: 0 %
Basophils Absolute: 0 10*3/uL (ref 0–0.1)
Eosinophils Absolute: 0 10*3/uL (ref 0–0.7)
Eosinophils Relative: 0 %
HEMATOCRIT: 33.9 % — AB (ref 35.0–47.0)
HEMOGLOBIN: 12 g/dL (ref 12.0–16.0)
Lymphocytes Relative: 4 %
Lymphs Abs: 0.4 10*3/uL — ABNORMAL LOW (ref 1.0–3.6)
MCH: 32.2 pg (ref 26.0–34.0)
MCHC: 35.3 g/dL (ref 32.0–36.0)
MCV: 91.2 fL (ref 80.0–100.0)
MONOS PCT: 5 %
Monocytes Absolute: 0.4 10*3/uL (ref 0.2–0.9)
NEUTROS ABS: 8.4 10*3/uL — AB (ref 1.4–6.5)
NEUTROS PCT: 91 %
Platelets: 209 10*3/uL (ref 150–440)
RBC: 3.71 MIL/uL — AB (ref 3.80–5.20)
RDW: 13.4 % (ref 11.5–14.5)
WBC: 9.3 10*3/uL (ref 3.6–11.0)

## 2015-12-08 LAB — BASIC METABOLIC PANEL
Anion gap: 5 (ref 5–15)
BUN: 15 mg/dL (ref 6–20)
CHLORIDE: 109 mmol/L (ref 101–111)
CO2: 25 mmol/L (ref 22–32)
CREATININE: 0.74 mg/dL (ref 0.44–1.00)
Calcium: 9.2 mg/dL (ref 8.9–10.3)
GFR calc non Af Amer: >60 mL/min (ref >60)
Glucose, Bld: 119 mg/dL — ABNORMAL HIGH (ref 65–99)
POTASSIUM: 4 mmol/L (ref 3.5–5.1)
SODIUM: 139 mmol/L (ref 135–145)

## 2015-12-08 LAB — MAGNESIUM: MAGNESIUM: 2.1 mg/dL (ref 1.7–2.4)

## 2015-12-08 MED ORDER — DIAZEPAM 5 MG PO TABS
5.0000 mg | ORAL_TABLET | Freq: Once | ORAL | Status: AC
  Administered 2015-12-08: 5 mg via ORAL

## 2015-12-08 MED ORDER — DIAZEPAM 5 MG/ML IJ SOLN: 2.5000 mg | Freq: Once | INTRAMUSCULAR | Status: DC

## 2015-12-08 MED ORDER — DIAZEPAM 5 MG PO TABS
ORAL_TABLET | ORAL | Status: AC
  Administered 2015-12-08: 5 mg via ORAL
  Filled 2015-12-08: qty 1

## 2015-12-08 NOTE — ED Triage Notes (Signed)
Pt is being treated for Breast Cancer and comes in today w/ c/o neck and back pain that radiates to the head.Pain is 8/10 and started this morning around 7AM.  Pt received her first chemo treatment Monday. Denies numbness, tingling, and fever. Pt AOx4, support at bedside.

## 2015-12-08 NOTE — ED Provider Notes (Signed)
[facility] Emergency Department Provider Note  ____________________________________________   First MD Initiated Contact with Patient 12/08/15 [phone]     (approximate)  I have reviewed the triage vital signs and the nursing notes.   HISTORY  Chief Complaint Neck Pain and Back Pain    HPI Carly Davis is a 35 y.o. female is a history of breast cancer who started chemotherapy (ddAC + Neulasta) at [facility] 2 days ago (12/06/2015) who presents today for acute onset muscle spasms and pain that started in her neck and radiated throughout her body.  She reports that it started acutely and was severe, keeping her from being able to turn her head or even swallow.  She still is very anxious but is able to move her head fully now although she is worried that sensation is going to come back.  She still feels achy throughout her body describes symptoms as moderate at this time.  She denies lightheadedness, syncope, chest pain, shortness of breath, nausea, vomiting, diarrhea, abdominal pain, dysuria.  She received her first treatment 2 days ago.  She has not had the opportunity to call her doctor this morning; the symptoms were severe enough that she did not think she could make it [facility] but she feels better at this time.She denies any numbness, tingling, or weakness in any of her extremities, no facial droop, no visual changes.   Past Medical History:  Diagnosis Date  . Breast cancer ([place])    start chemo at [facility] on 12/06/15    There are no active problems to display for this patient.   Past Surgical History:  Procedure Laterality Date  . TONSILLECTOMY      Prior to Admission medications   Medication Sig Start Date End Date Taking? Authorizing Provider  benzonatate (TESSALON PERLES) 100 MG capsule Take 1 capsule (100 mg total) by mouth 3 (three) times daily as needed for cough. Patient not taking: Reported on 12/08/2015 05/16/15 05/15/16  Harvest Dark, PA-C     Allergies Amoxicillin and Penicillins  History reviewed. No pertinent family history.  Social History Social History  Substance Use Topics  . Smoking status: Never Smoker  . Smokeless tobacco: Never Used  . Alcohol use No    Review of Systems Constitutional: No fever/chills Eyes: No visual changes. ENT: No sore throat. Cardiovascular: Denies chest pain. Respiratory: Denies shortness of breath. Gastrointestinal: No abdominal pain.  No nausea, no vomiting.  No diarrhea.  No constipation. Genitourinary: Negative for dysuria. Musculoskeletal: Negative Skin: Negative for rash. Neurological: Acute onset of severe pain/spasms in her neck that radiates into her head as well as all the way down her back and throughout her body.  10-point ROS otherwise negative.  ____________________________________________   PHYSICAL EXAM:  VITAL SIGNS: ED Triage Vitals  Enc Vitals Group     BP 12/08/15 0847 132/74     Pulse Rate 12/08/15 0847 84     Resp 12/08/15 0847 16     Temp 12/08/15 0847 97.6 F (36.4 C)     Temp Source 12/08/15 0847 Oral     SpO2 12/08/15 0847 96 %     Weight 12/08/15 0844 167 lb (75.8 kg)     Height 12/08/15 0844 5\' 5"  (1.651 m)     Head Circumference --      Peak Flow --      Pain Score 12/08/15 0844 8     Pain Loc --      Pain Edu? --  Excl. in [place]? --     Constitutional: Alert and oriented. Well appearing and in no acute distress but very anxious Eyes: Conjunctivae are normal. PERRL. EOMI. Head: Atraumatic. Nose: No congestion/rhinnorhea. Mouth/Throat: Mucous membranes are moist.  Oropharynx non-erythematous. Neck: No stridor.  No meningeal signs; full, unimpeded and nontender range of motion of her head and neck at this time.  No cervical spine tenderness to palpation. Cardiovascular: Normal rate, regular rhythm. Good peripheral circulation. Grossly normal heart sounds. Respiratory: Normal respiratory effort.  No retractions. Lungs  CTAB. Gastrointestinal: Soft and nontender. No distention.  Musculoskeletal: No lower extremity tenderness nor edema. No gross deformities of extremities. Neurologic:  Normal speech and language. No gross focal neurologic deficits are appreciated.  No Facial droop, no numbness nor weakness in any of her extremities Skin:  Skin is warm, dry and intact. No rash noted. Psychiatric: Mood and affect are normal. Speech and behavior are normal.  ____________________________________________   LABS (all labs ordered are listed, but only abnormal results are displayed)  Labs Reviewed  CBC WITH DIFFERENTIAL/PLATELET - Abnormal; Notable for the following:       Result Value   RBC 3.71 (*)    HCT 33.9 (*)    Neutro Abs 8.4 (*)    Lymphs Abs 0.4 (*)    All other components within normal limits  BASIC METABOLIC PANEL - Abnormal; Notable for the following:    Glucose, Bld 119 (*)    All other components within normal limits  MAGNESIUM   ____________________________________________  EKG  None - EKG not ordered by ED physician ____________________________________________  RADIOLOGY   No results found.  ____________________________________________   PROCEDURES  Procedure(s) performed:   Procedures   Critical Care performed: No ____________________________________________   INITIAL IMPRESSION / ASSESSMENT AND PLAN / ED COURSE  Pertinent labs & imaging results that were available during my care of the patient were reviewed by me and considered in my medical decision making (see chart for details).  She has normal vital signs.  Even though she is anxious but the symptoms returning she is relatively asymptomatic this time and has had a significant improvement with the muscle spasms, she is now able to turn her head side-to-side and flex and extend without difficulty.  There is no indication of meningitis or acute neurological compromise.  I will check some basic labs, recognizing  that CBC in particular is likely to be abnormal given that she received Neulasta yesterday.  Anticipate touching base with her oncologist at [facility].  I will give her a dose of IV Valium to help relax her and help with the spasms.   Clinical Course  Comment By Time  Patient was sleeping lightly.  She woke when I came in the room and stated she feels much better.  Labs are unremarkable.  I will touch base with her oncologist. Hinda Kehr, MD 08/30 1019  Spoke by phone w/ Dr. Winn Jock at [facility].  She agrees that workup was appropriate and that everything so far has been reassuring.  Suggested Neulasta may be the cause, versus anxiety/muscle spasms.  We agreed we would avoid narcotics; Dr. Amedeo Plenty prefers NSAIDs.  Discussed all this with patient and spouse.  She is currently asymptomatic.  D/C w/ outpatient f/u. Hinda Kehr, MD 08/30 1120    ____________________________________________  FINAL CLINICAL IMPRESSION(S) / ED DIAGNOSES  Final diagnoses:  Muscle cramps  Neck pain     MEDICATIONS GIVEN DURING THIS VISIT:  Medications  diazepam (VALIUM) tablet 5 mg (5  mg Oral Given 12/08/15 0946)     NEW OUTPATIENT MEDICATIONS STARTED DURING THIS VISIT:  Discharge Medication List as of 12/08/2015 11:25 AM        Note:  This document was prepared using Dragon voice recognition software and may include unintentional dictation errors.    Hinda Kehr, MD 12/08/15 [phone]

## 2015-12-08 NOTE — Discharge Instructions (Signed)
As we discussed, your workup today was reassuring.  Though we do not know exactly what is causing your symptoms, it appears that you have no emergent medical condition at this time are safe to go home and follow up as recommended in this paperwork.  Dr. Winn Jock suggested you try regular doses of ibuprofen according to label instructions.  Please return immediately to the Emergency Department if you develop any new or worsening symptoms that concern you.

## 2016-07-10 DIAGNOSIS — Z9889 Other specified postprocedural states: Secondary | ICD-10-CM | POA: Insufficient documentation

## 2016-07-10 DIAGNOSIS — Z9013 Acquired absence of bilateral breasts and nipples: Secondary | ICD-10-CM | POA: Insufficient documentation

## 2016-07-10 DIAGNOSIS — Z9882 Breast implant status: Secondary | ICD-10-CM

## 2017-01-05 ENCOUNTER — Ambulatory Visit: Payer: Self-pay | Admitting: Podiatry

## 2017-02-05 DIAGNOSIS — Z1501 Genetic susceptibility to malignant neoplasm of breast: Secondary | ICD-10-CM | POA: Insufficient documentation

## 2017-04-30 DIAGNOSIS — G43909 Migraine, unspecified, not intractable, without status migrainosus: Secondary | ICD-10-CM | POA: Insufficient documentation

## 2017-05-06 ENCOUNTER — Encounter: Payer: Self-pay | Admitting: Emergency Medicine

## 2017-05-06 ENCOUNTER — Emergency Department
Admission: EM | Admit: 2017-05-06 | Discharge: 2017-05-06 | Disposition: A | Discharge status: Home / Self Care | Payer: Medicaid Other | Attending: Student in an Organized Health Care Education/Training Program | Admitting: Student in an Organized Health Care Education/Training Program

## 2017-05-06 ENCOUNTER — Other Ambulatory Visit: Payer: Self-pay

## 2017-05-06 DIAGNOSIS — R51 Headache: Secondary | ICD-10-CM | POA: Diagnosis not present

## 2017-05-06 DIAGNOSIS — Z79899 Other long term (current) drug therapy: Secondary | ICD-10-CM | POA: Insufficient documentation

## 2017-05-06 DIAGNOSIS — R519 Headache, unspecified: Secondary | ICD-10-CM

## 2017-05-06 DIAGNOSIS — Z853 Personal history of malignant neoplasm of breast: Secondary | ICD-10-CM | POA: Diagnosis not present

## 2017-05-06 LAB — POC URINE PREG, ED: PREG TEST UR: NEGATIVE

## 2017-05-06 MED ORDER — PROCHLORPERAZINE EDISYLATE 5 MG/ML IJ SOLN
10.0000 mg | Freq: Once | INTRAMUSCULAR | Status: AC
  Administered 2017-05-06: 10 mg via INTRAVENOUS
  Filled 2017-05-06: qty 2

## 2017-05-06 MED ORDER — DIPHENHYDRAMINE HCL 50 MG/ML IJ SOLN
25.0000 mg | Freq: Once | INTRAMUSCULAR | Status: AC
  Administered 2017-05-06: 25 mg via INTRAVENOUS
  Filled 2017-05-06: qty 1

## 2017-05-06 MED ORDER — ACETAMINOPHEN 500 MG PO TABS
1000.0000 mg | ORAL_TABLET | Freq: Once | ORAL | Status: AC
  Administered 2017-05-06: 1000 mg via ORAL
  Filled 2017-05-06: qty 2

## 2017-05-06 MED ORDER — SODIUM CHLORIDE 0.9 % IV BOLUS (SEPSIS)
1000.0000 mL | Freq: Once | INTRAVENOUS | Status: AC
  Administered 2017-05-06: 1000 mL via INTRAVENOUS

## 2017-05-06 MED ORDER — DEXAMETHASONE SODIUM PHOSPHATE 10 MG/ML IJ SOLN
10.0000 mg | Freq: Once | INTRAMUSCULAR | Status: AC
  Administered 2017-05-06: 10 mg via INTRAVENOUS
  Filled 2017-05-06: qty 1

## 2017-05-06 MED ORDER — PROMETHAZINE HCL 25 MG/ML IJ SOLN
12.5000 mg | Freq: Four times a day (QID) | INTRAMUSCULAR | Status: DC | PRN
  Filled 2017-05-06: qty 1

## 2017-05-06 MED ORDER — KETOROLAC TROMETHAMINE 30 MG/ML IJ SOLN
15.0000 mg | Freq: Once | INTRAMUSCULAR | Status: AC
  Administered 2017-05-06: 15 mg via INTRAVENOUS
  Filled 2017-05-06: qty 1

## 2017-05-06 NOTE — Discharge Instructions (Signed)

## 2017-05-06 NOTE — ED Notes (Addendum)
RESTRICTED RIGHT ARM - LYMPH NODE REMOVED ON RIGHT SIDE  NO STICKS OR BLOOD PRESSURE

## 2017-05-06 NOTE — ED Triage Notes (Signed)
Pt to ED via POV c/o migraine. Pt states that she started with headache last night. Pt took OTC medication but it did not help. Pt states that she is having N/V and photosensitivity. Pt has hx/o migraines but states that she has not had one this bad in a while. Pt crying in triage and appears uncomfortable.

## 2017-05-06 NOTE — ED Notes (Signed)

## 2017-05-06 NOTE — ED Provider Notes (Signed)
[facility] Emergency Department Provider Note    First MD Initiated Contact with Patient 05/06/17 [phone]     (approximate)  I have reviewed the triage vital signs and the nursing notes.   HISTORY  Chief Complaint Migraine    HPI Carly Davis is a 37 y.o. female presents with chief complaint of moderate to severe left-sided headache that started last night.  Similar to previous migraine headaches but has not had one this bad in a while.  States that she typically able to get some improvement with over-the-counter medications and she took some BC Powder but did not have any relief.  States that she is having worsening nausea and vomiting as well as photophobia.  No neck pain.  No fevers.  No numbness or tingling.  No chest pain.  No recent sick contacts.  Denies any chance of being pregnant.  Past Medical History:  Diagnosis Date  . Breast cancer ([place])    start chemo at [facility] on 12/06/15   No family history on file. Past Surgical History:  Procedure Laterality Date  . MASTECTOMY    . TONSILLECTOMY     Patient Active Problem List   Diagnosis Date Noted  . Acquired absence of breast, bilateral 07/10/2016  . History of breast reconstruction 07/10/2016  . 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 Anxiety 06/09/2014  . Tension-type headache, not intractable 06/09/2014      Prior to Admission medications   Medication Sig Start Date End Date Taking? Authorizing Provider  albuterol (PROVENTIL HFA;VENTOLIN HFA) 108 (90 Base) MCG/ACT inhaler Inhale into the lungs. 07/08/14   [provider]  azithromycin (ZITHROMAX) 250 MG tablet Take first 2 tablets together, then 1 every day until finished. 01/04/17   [provider]  furosemide (LASIX) 20 MG tablet Take 20 mg by mouth. 03/13/16 03/13/17  [provider]  ibuprofen (ADVIL,MOTRIN) 200 MG tablet Take 400 mg by mouth.    [provider]   LORazepam (ATIVAN) 0.5 MG tablet Take 0.5 mg by mouth. 10/31/16   [provider]  rizatriptan (MAXALT) 10 MG tablet Take 10 mg by mouth. 12/17/16   [provider]    Allergies Amoxicillin and Penicillins    Social History Social History   Tobacco Use  . Smoking status: Never Smoker  . Smokeless tobacco: Never Used  Substance Use Topics  . Alcohol use: No  . Drug use: No    Review of Systems Patient denies headaches, rhinorrhea, blurry vision, numbness, shortness of breath, chest pain, edema, cough, abdominal pain, nausea, vomiting, diarrhea, dysuria, fevers, rashes or hallucinations unless otherwise stated above in HPI. ____________________________________________   PHYSICAL EXAM:  VITAL SIGNS: Vitals:   05/06/17 0944  BP: 127/75  Pulse: 75  Resp: 20  Temp: 97.9 F (36.6 C)  SpO2: 96%    Constitutional: Alert and oriented. in no acute distress. Eyes: Conjunctivae are normal.  Head: Atraumatic. Nose: No congestion/rhinnorhea. Mouth/Throat: Mucous membranes are moist.   Neck: No stridor. Painless ROM.  Cardiovascular: Normal rate, regular rhythm. Grossly normal heart sounds.  Good peripheral circulation. Respiratory: Normal respiratory effort.  No retractions. Lungs CTAB. Gastrointestinal: Soft and nontender. No distention. No abdominal bruits. No CVA tenderness. Genitourinary:  Musculoskeletal: No lower extremity tenderness nor edema.  No joint effusions. Neurologic:  CN- intact.  No facial droop, Normal FNF.  Normal heel to shin.  Sensation intact bilaterally. Normal speech and language. No gross focal neurologic deficits  are appreciated. No gait instability. Skin:  Skin is warm, dry and intact. No rash noted. Psychiatric: Mood and affect are normal. Speech and behavior are normal.  ____________________________________________   LABS (all labs ordered are listed, but only abnormal results are displayed)  Results for orders placed or  performed during the hospital encounter of 05/06/17 (from the past 24 hour(s))  POC Urine Pregnancy, ED     Status: None   Collection Time: 05/06/17 12:25 PM  Result Value Ref Range   Preg Test, Ur Negative Negative   ____________________________________________ ___________________________________________   PROCEDURES  Procedure(s) performed:  Procedures    Critical Care performed: no ____________________________________________   INITIAL IMPRESSION / ASSESSMENT AND PLAN / ED COURSE  Pertinent labs & imaging results that were available during my care of the patient were reviewed by me and considered in my medical decision making (see chart for details).  DDX: migraine, tension, sah, cluster, meningitis  Carly Davis is a 37 y.o. who presents to the ED with with Hx of migraines p/w HA for last day. Not worst HA ever. Gradual onset. HA similar to previous episodes. Denies focal neurologic symptoms. Denies trauma. No fevers or neck pain. No vision loss. Afebrile in ED. VSS. Exam as above. No meningeal signs. No CN, motor, sensory or cerebellar deficits. Temporal arteries palpable and non-tender. Appears well and non-toxic.  Will provide IV fluids for hydration and IV medications for symptom control. Likely  non-specific or possible migraine HA. Clinical picture is not consistent with ICH, SAH, SDH, EDH, TIA, or CVA. No concern for meningitis or encephalitis. No concern for GCA/Temporal arteritis.   ----------------------------------------- 12:36 PM on 05/06/2017 -----------------------------------------   Pain improved. Repeat neuro exam is again without focal deficit, nuchal rigidity or evidence of meningeal irritation.  Stable to D/C home, follow up with PCP or Neurology if persistent recurrent Has.  Have discussed with the patient and available family all diagnostics and treatments performed thus far and all questions were answered to the best of my ability. The patient  demonstrates understanding and agreement with plan.        ____________________________________________   FINAL CLINICAL IMPRESSION(S) / ED DIAGNOSES  Final diagnoses:  Bad headache      NEW MEDICATIONS STARTED DURING THIS VISIT:  New Prescriptions   No medications on file     Note:  This document was prepared using Dragon voice recognition software and may include unintentional dictation errors.    Merlyn Lot, MD 05/06/17 1236

## 2017-05-06 NOTE — ED Notes (Signed)
FN: pt arrives with c/o having a migraine

## 2018-01-06 ENCOUNTER — Emergency Department
Admission: EM | Admit: 2018-01-06 | Discharge: 2018-01-06 | Disposition: A | Discharge status: Home / Self Care | Payer: Self-pay | Attending: Emergency Medicine | Admitting: Emergency Medicine

## 2018-01-06 ENCOUNTER — Emergency Department: Payer: Self-pay

## 2018-01-06 ENCOUNTER — Encounter: Payer: Self-pay | Admitting: Emergency Medicine

## 2018-01-06 ENCOUNTER — Other Ambulatory Visit: Payer: Self-pay

## 2018-01-06 DIAGNOSIS — S32010A Wedge compression fracture of first lumbar vertebra, initial encounter for closed fracture: Secondary | ICD-10-CM | POA: Insufficient documentation

## 2018-01-06 DIAGNOSIS — Y998 Other external cause status: Secondary | ICD-10-CM | POA: Insufficient documentation

## 2018-01-06 DIAGNOSIS — Z853 Personal history of malignant neoplasm of breast: Secondary | ICD-10-CM | POA: Insufficient documentation

## 2018-01-06 DIAGNOSIS — S99912A Unspecified injury of left ankle, initial encounter: Secondary | ICD-10-CM | POA: Insufficient documentation

## 2018-01-06 DIAGNOSIS — Y92410 Unspecified street and highway as the place of occurrence of the external cause: Secondary | ICD-10-CM | POA: Insufficient documentation

## 2018-01-06 DIAGNOSIS — Y93I9 Activity, other involving external motion: Secondary | ICD-10-CM | POA: Insufficient documentation

## 2018-01-06 LAB — BASIC METABOLIC PANEL
Anion gap: 10 (ref 5–15)
BUN: 18 mg/dL (ref 6–20)
CHLORIDE: 105 mmol/L (ref 98–111)
CO2: 26 mmol/L (ref 22–32)
CREATININE: 0.8 mg/dL (ref 0.44–1.00)
Calcium: 9.6 mg/dL (ref 8.9–10.3)
Glucose, Bld: 116 mg/dL — ABNORMAL HIGH (ref 70–99)
Potassium: 3.6 mmol/L (ref 3.5–5.1)
SODIUM: 141 mmol/L (ref 135–145)

## 2018-01-06 LAB — CBC WITH DIFFERENTIAL/PLATELET
BASOS ABS: 0 10*3/uL (ref 0–0.1)
BASOS PCT: 1 %
Eosinophils Absolute: 0.1 10*3/uL (ref 0–0.7)
Eosinophils Relative: 1 %
HCT: 37.6 % (ref 35.0–47.0)
Hemoglobin: 13.1 g/dL (ref 12.0–16.0)
Lymphocytes Relative: 13 %
Lymphs Abs: 1.1 10*3/uL (ref 1.0–3.6)
MCH: 33.1 pg (ref 26.0–34.0)
MCHC: 34.9 g/dL (ref 32.0–36.0)
MCV: 94.7 fL (ref 80.0–100.0)
MONO ABS: 0.6 10*3/uL (ref 0.2–0.9)
Monocytes Relative: 7 %
NEUTROS ABS: 6.8 10*3/uL — AB (ref 1.4–6.5)
NEUTROS PCT: 78 %
PLATELETS: 248 10*3/uL (ref 150–440)
RBC: 3.97 MIL/uL (ref 3.80–5.20)
RDW: 13.1 % (ref 11.5–14.5)
WBC: 8.6 10*3/uL (ref 3.6–11.0)

## 2018-01-06 LAB — POCT PREGNANCY, URINE: PREG TEST UR: NEGATIVE

## 2018-01-06 LAB — TROPONIN I: Troponin I: <0.03 ng/mL (ref <0.03)

## 2018-01-06 MED ORDER — KETOROLAC TROMETHAMINE 30 MG/ML IJ SOLN
30.0000 mg | Freq: Once | INTRAMUSCULAR | Status: AC
  Administered 2018-01-06: 30 mg via INTRAVENOUS
  Filled 2018-01-06: qty 1

## 2018-01-06 MED ORDER — HYDROCODONE-ACETAMINOPHEN 5-325 MG PO TABS: 1.0000 | ORAL_TABLET | ORAL | 0 refills | Status: AC | PRN
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
## 2018-02-18 ENCOUNTER — Emergency Department
Admission: EM | Admit: 2018-02-18 | Discharge: 2018-02-18 | Disposition: A | Discharge status: Home / Self Care | Payer: Medicaid Other | Attending: Emergency Medicine | Admitting: Emergency Medicine

## 2018-02-18 ENCOUNTER — Encounter: Payer: Self-pay | Admitting: Emergency Medicine

## 2018-02-18 DIAGNOSIS — Z79899 Other long term (current) drug therapy: Secondary | ICD-10-CM | POA: Insufficient documentation

## 2018-02-18 DIAGNOSIS — Z853 Personal history of malignant neoplasm of breast: Secondary | ICD-10-CM | POA: Insufficient documentation

## 2018-02-18 DIAGNOSIS — R197 Diarrhea, unspecified: Secondary | ICD-10-CM | POA: Insufficient documentation

## 2018-02-18 DIAGNOSIS — R112 Nausea with vomiting, unspecified: Secondary | ICD-10-CM | POA: Insufficient documentation

## 2018-02-18 LAB — URINALYSIS, COMPLETE (UACMP) WITH MICROSCOPIC
BACTERIA UA: NONE SEEN
Bilirubin Urine: NEGATIVE
Glucose, UA: NEGATIVE mg/dL
Hgb urine dipstick: NEGATIVE
KETONES UR: NEGATIVE mg/dL
Leukocytes, UA: NEGATIVE
Nitrite: NEGATIVE
PROTEIN: NEGATIVE mg/dL
Specific Gravity, Urine: 1.025 (ref 1.005–1.030)
pH: 5 (ref 5.0–8.0)

## 2018-02-18 LAB — CBC
HCT: 40.4 % (ref 36.0–46.0)
HEMOGLOBIN: 13.2 g/dL (ref 12.0–15.0)
MCH: 31.4 pg (ref 26.0–34.0)
MCHC: 32.7 g/dL (ref 30.0–36.0)
MCV: 96.2 fL (ref 80.0–100.0)
PLATELETS: 195 10*3/uL (ref 150–400)
RBC: 4.2 MIL/uL (ref 3.87–5.11)
RDW: 11.9 % (ref 11.5–15.5)
WBC: 6.3 10*3/uL (ref 4.0–10.5)
nRBC: 0 % (ref 0.0–0.2)

## 2018-02-18 LAB — COMPREHENSIVE METABOLIC PANEL
ALK PHOS: 85 U/L (ref 38–126)
ALT: 17 U/L (ref 0–44)
ANION GAP: 10 (ref 5–15)
AST: 20 U/L (ref 15–41)
Albumin: 4 g/dL (ref 3.5–5.0)
BUN: 19 mg/dL (ref 6–20)
CALCIUM: 8.7 mg/dL — AB (ref 8.9–10.3)
CO2: 26 mmol/L (ref 22–32)
Chloride: 103 mmol/L (ref 98–111)
Creatinine, Ser: 0.73 mg/dL (ref 0.44–1.00)
GFR calc Af Amer: >60 mL/min (ref >60)
GFR calc non Af Amer: >60 mL/min (ref >60)
Glucose, Bld: 118 mg/dL — ABNORMAL HIGH (ref 70–99)
Potassium: 3.7 mmol/L (ref 3.5–5.1)
Sodium: 139 mmol/L (ref 135–145)
Total Bilirubin: 0.7 mg/dL (ref 0.3–1.2)
Total Protein: 7.4 g/dL (ref 6.5–8.1)

## 2018-02-18 LAB — LIPASE, BLOOD: Lipase: 25 U/L (ref 11–51)

## 2018-02-18 MED ORDER — ONDANSETRON HCL 4 MG/2ML IJ SOLN
4.0000 mg | Freq: Once | INTRAMUSCULAR | Status: AC
  Administered 2018-02-18: 4 mg via INTRAVENOUS
  Filled 2018-02-18: qty 2

## 2018-02-18 MED ORDER — DIPHENHYDRAMINE HCL 50 MG/ML IJ SOLN
25.0000 mg | Freq: Once | INTRAMUSCULAR | Status: AC
  Administered 2018-02-18: 25 mg via INTRAVENOUS
  Filled 2018-02-18: qty 1

## 2018-02-18 MED ORDER — KETOROLAC TROMETHAMINE 30 MG/ML IJ SOLN
30.0000 mg | Freq: Once | INTRAMUSCULAR | Status: AC
  Administered 2018-02-18: 30 mg via INTRAVENOUS
  Filled 2018-02-18: qty 1

## 2018-02-18 MED ORDER — ONDANSETRON HCL 4 MG PO TABS: 4.0000 mg | ORAL_TABLET | Freq: Three times a day (TID) | ORAL | 1 refills | Status: AC | PRN

## 2018-02-18 MED ORDER — SODIUM CHLORIDE 0.9 % IV BOLUS
1000.0000 mL | Freq: Once | INTRAVENOUS | Status: AC
  Administered 2018-02-18: 1000 mL via INTRAVENOUS

## 2018-02-18 NOTE — Discharge Instructions (Addendum)
Return to the emergency room immediately for any worsening condition including fever, dental pain, new or worsening headache, any one-sided weakness or numbness, concern for dehydration such as not making urine or any dizziness, passing out, or any other symptoms concerning to you.

## 2018-02-18 NOTE — ED Triage Notes (Signed)
Patient reports nausea, vomiting and diarrhea.  Patient states, "I think my kids gave me a stomach virus, but I can't even keep water down."  Patient states symptoms began yesterday evening.  Patient reports vomiting x 20 and diarrhea x 2 since symptoms began.

## 2018-02-18 NOTE — ED Triage Notes (Signed)
Awaiting room.  No change in status.

## 2018-02-18 NOTE — ED Notes (Addendum)
Patient awaiting further plan of care. Sleeping whhen nurse went into room to reassess, pain. NS # 2 infusing will monitor.

## 2018-02-18 NOTE — ED Notes (Signed)
md to eval and plan care, VSS , patient c/o of nausea/ headache and feeling dehydrated.

## 2018-02-18 NOTE — ED Notes (Signed)
Patient reports feeling like she has a viris. Reports feeling nausea and vomiting headache that began a few hours ago. Patient states feeling like she's dehydrated. Awaiting md eval

## 2018-02-18 NOTE — ED Provider Notes (Signed)
[facility] Emergency Department Provider Note ____________________________________________   I have reviewed the triage vital signs and the triage nursing note.  HISTORY  Chief Complaint Emesis and Diarrhea   Historian Patient  HPI Carly Davis is a 37 y.o. female presenting with  vomiting since yesterday afternoon over 20 times, nonbloody nonbilious.  She has had 2 loose bowel movements.  No real document of fevers.  No chest pain.  Mild abdominal cramps without significant abdominal pain.  A few hours ago she developed gradual onset generalized headache similar to prior migraine headaches.  No known bad food or sick contacts or significant travel history with respect to the nausea vomiting diarrhea.  Vomiting nausea are severe.    Past Medical History:  Diagnosis Date  . Breast cancer ([place])    start chemo at [facility] on 12/06/15    Patient Active Problem List   Diagnosis Date Noted  . Acquired absence of breast, bilateral 07/10/2016  . History of breast reconstruction 07/10/2016  . Malignant neoplasm of upper-inner quadrant of right female breast ([place]) 11/24/2015  . Anxiety 06/09/2014  . Tension-type headache, not intractable 06/09/2014    Past Surgical History:  Procedure Laterality Date  . ABDOMINAL HYSTERECTOMY    . MASTECTOMY    . TONSILLECTOMY      Prior to Admission medications   Medication Sig Start Date End Date Taking? Authorizing Provider  albuterol (PROVENTIL HFA;VENTOLIN HFA) 108 (90 Base) MCG/ACT inhaler Inhale into the lungs. 07/08/14   [provider]  azithromycin (ZITHROMAX) 250 MG tablet Take first 2 tablets together, then 1 every day until finished. 01/04/17   [provider]  furosemide (LASIX) 20 MG tablet Take 20 mg by mouth. 03/13/16 03/13/17  [provider]  HYDROcodone-acetaminophen (NORCO/VICODIN) 5-325 MG tablet Take 1 tablet by mouth every 4 (four) hours as needed for moderate pain. 01/06/18  01/06/19  Harvest Dark, MD  ibuprofen (ADVIL,MOTRIN) 200 MG tablet Take 400 mg by mouth.    [provider]  LORazepam (ATIVAN) 0.5 MG tablet Take 0.5 mg by mouth. 10/31/16   [provider]  ondansetron (ZOFRAN) 4 MG tablet Take 1 tablet (4 mg total) by mouth every 8 (eight) hours as needed for nausea or vomiting. 02/18/18   Lisa Roca, MD  rizatriptan (MAXALT) 10 MG tablet Take 10 mg by mouth. 12/17/16   [provider]    Allergies  Allergen Reactions  . Amoxicillin Hives  . Penicillins Hives    No family history on file.  Social History Social History   Tobacco Use  . Smoking status: Never Smoker  . Smokeless tobacco: Never Used  Substance Use Topics  . Alcohol use: No  . Drug use: No    Review of Systems  Constitutional: Negative for fever. Eyes: Negative for visual changes. ENT: Negative for sore throat. Cardiovascular: Negative for chest pain. Respiratory: Negative for shortness of breath. Gastrointestinal: Positive for vomiting and diarrhea. Genitourinary: Negative for dysuria. Musculoskeletal: Negative for back pain. Skin: Negative for rash. Neurological: Positive for global headache.    ____________________________________________   PHYSICAL EXAM:  VITAL SIGNS: ED Triage Vitals  Enc Vitals Group     BP 02/18/18 1113 109/68     Pulse Rate 02/18/18 1113 94     Resp 02/18/18 1113 18     Temp 02/18/18 1113 98.3 F (36.8 C)     Temp Source 02/18/18 1113 Oral     SpO2 02/18/18 1113 100 %     Weight  02/18/18 1114 164 lb (74.4 kg)     Height 02/18/18 1114 5\' 5"  (1.651 m)     Head Circumference --      Peak Flow --      Pain Score 02/18/18 1113 5     Pain Loc --      Pain Edu? --      Excl. in [place]? --      Constitutional: Alert and oriented.  HEENT      Head: Normocephalic and atraumatic.      Eyes: Conjunctivae are normal. Pupils equal and round.       Ears:         Nose: No congestion/rhinnorhea.       Mouth/Throat: Mucous membranes are moderately dry.      Neck: No stridor. Cardiovascular/Chest: Normal rate, regular rhythm.  No murmurs, rubs, or gallops. Respiratory: Normal respiratory effort without tachypnea nor retractions. Breath sounds are clear and equal bilaterally. No wheezes/rales/rhonchi. Gastrointestinal: Soft. No distention, no guarding, no rebound. Nontender.    Genitourinary/rectal:Deferred Musculoskeletal: Nontender with normal range of motion in all extremities. No joint effusions.  No lower extremity tenderness.  No edema. Neurologic:  Normal speech and language. No gross or focal neurologic deficits are appreciated. Skin:  Skin is warm, dry and intact. No rash noted. Psychiatric: Mood and affect are normal. Speech and behavior are normal. Patient exhibits appropriate insight and judgment.   ____________________________________________  LABS (pertinent positives/negatives) I, Lisa Roca, MD the attending physician have reviewed the labs noted below.  Labs Reviewed  COMPREHENSIVE METABOLIC PANEL - Abnormal; Notable for the following components:      Result Value   Glucose, Bld 118 (*)    Calcium 8.7 (*)    All other components within normal limits  URINALYSIS, COMPLETE (UACMP) WITH MICROSCOPIC - Abnormal; Notable for the following components:   Color, Urine YELLOW (*)    APPearance CLEAR (*)    All other components within normal limits  LIPASE, BLOOD  CBC    ____________________________________________    EKG I, Lisa Roca, MD, the attending physician have personally viewed and interpreted all ECGs.  None ____________________________________________  RADIOLOGY   None __________________________________________  PROCEDURES  Procedure(s) performed: None  Procedures  Critical Care performed: None   ____________________________________________  ED COURSE / ASSESSMENT AND PLAN  Pertinent labs & imaging results that were available during my  care of the patient were reviewed by me and considered in my medical decision making (see chart for details).     Patient looks clinically mildly dehydrated, will start IV fluids.  Laboratory studies are overall reassuring with no electrolyte disturbances, normal BUN and creatinine.  Her white blood cell count is normal.  Urinalysis is reassuring.  I am most suspicious of viral GI illness with both vomiting and diarrhea.  In terms of the headache she states this was gradual onset and feels like prior migraine, we will go ahead and give migraine cocktail as well, suspect brought on by the viral illness and vomiting dehydration.  6 PM reevaluation, patient states headache is almost all but gone.  She feels much better.  She has had 1/2 L of fluid and has about 500 cc left.  We discussed discharge home with Zofran, and over-the-counter Imodium if needed for diarrhea.      CONSULTATIONS:   None   Patient / Family / Caregiver informed of clinical course, medical decision-making process, and agree with plan.   I discussed return precautions, follow-up instructions, and discharge instructions  with patient and/or family.  Discharge Instructions : Return to the emergency room immediately for any worsening condition including fever, dental pain, new or worsening headache, any one-sided weakness or numbness, concern for dehydration such as not making urine or any dizziness, passing out, or any other symptoms concerning to you.    ___________________________________________   FINAL CLINICAL IMPRESSION(S) / ED DIAGNOSES   Final diagnoses:  Nausea vomiting and diarrhea      ___________________________________________         Note: This dictation was prepared with Dragon dictation. Any transcriptional errors that result from this process are unintentional    Lisa Roca, MD 02/18/18 [phone]

## 2018-09-28 ENCOUNTER — Emergency Department
Admission: EM | Admit: 2018-09-28 | Discharge: 2018-09-28 | Discharge status: Against Medical Advice | Payer: Self-pay | Attending: Emergency Medicine | Admitting: Emergency Medicine

## 2018-09-28 ENCOUNTER — Emergency Department: Payer: Self-pay

## 2018-09-28 ENCOUNTER — Other Ambulatory Visit: Payer: Self-pay

## 2018-09-28 ENCOUNTER — Encounter: Payer: Self-pay | Admitting: Emergency Medicine

## 2018-09-28 DIAGNOSIS — Z5321 Procedure and treatment not carried out due to patient leaving prior to being seen by health care provider: Secondary | ICD-10-CM | POA: Insufficient documentation

## 2018-09-28 DIAGNOSIS — M79604 Pain in right leg: Secondary | ICD-10-CM | POA: Insufficient documentation

## 2018-09-28 NOTE — ED Triage Notes (Signed)
Patient reports moving furniture last week and table top fell on right lower leg.  Patient reports last night with pain and hard area in right calf.

## 2018-11-16 ENCOUNTER — Other Ambulatory Visit: Payer: Self-pay

## 2018-11-16 ENCOUNTER — Encounter: Payer: Self-pay | Admitting: Emergency Medicine

## 2018-11-16 DIAGNOSIS — Z5321 Procedure and treatment not carried out due to patient leaving prior to being seen by health care provider: Secondary | ICD-10-CM | POA: Insufficient documentation

## 2018-11-16 DIAGNOSIS — R2231 Localized swelling, mass and lump, right upper limb: Secondary | ICD-10-CM | POA: Insufficient documentation

## 2018-11-16 LAB — COMPREHENSIVE METABOLIC PANEL
ALT: 15 U/L (ref 0–44)
AST: 21 U/L (ref 15–41)
Albumin: 4.1 g/dL (ref 3.5–5.0)
Alkaline Phosphatase: 98 U/L (ref 38–126)
Anion gap: 7 (ref 5–15)
BUN: 20 mg/dL (ref 6–20)
CO2: 26 mmol/L (ref 22–32)
Calcium: 9 mg/dL (ref 8.9–10.3)
Chloride: 107 mmol/L (ref 98–111)
Creatinine, Ser: 0.76 mg/dL (ref 0.44–1.00)
GFR calc Af Amer: >60 mL/min (ref >60)
GFR calc non Af Amer: >60 mL/min (ref >60)
Glucose, Bld: 92 mg/dL (ref 70–99)
Potassium: 3.4 mmol/L — ABNORMAL LOW (ref 3.5–5.1)
Sodium: 140 mmol/L (ref 135–145)
Total Bilirubin: 0.5 mg/dL (ref 0.3–1.2)
Total Protein: 7 g/dL (ref 6.5–8.1)

## 2018-11-16 LAB — CBC WITH DIFFERENTIAL/PLATELET
Abs Immature Granulocytes: 0.02 10*3/uL (ref 0.00–0.07)
Basophils Absolute: 0 10*3/uL (ref 0.0–0.1)
Basophils Relative: 0 %
Eosinophils Absolute: 0.4 10*3/uL (ref 0.0–0.5)
Eosinophils Relative: 4 %
HCT: 37 % (ref 36.0–46.0)
Hemoglobin: 12.2 g/dL (ref 12.0–15.0)
Immature Granulocytes: 0 %
Lymphocytes Relative: 22 %
Lymphs Abs: 2.1 10*3/uL (ref 0.7–4.0)
MCH: 32.3 pg (ref 26.0–34.0)
MCHC: 33 g/dL (ref 30.0–36.0)
MCV: 97.9 fL (ref 80.0–100.0)
Monocytes Absolute: 1 10*3/uL (ref 0.1–1.0)
Monocytes Relative: 10 %
Neutro Abs: 6 10*3/uL (ref 1.7–7.7)
Neutrophils Relative %: 64 %
Platelets: 246 10*3/uL (ref 150–400)
RBC: 3.78 MIL/uL — ABNORMAL LOW (ref 3.87–5.11)
RDW: 12.3 % (ref 11.5–15.5)
WBC: 9.5 10*3/uL (ref 4.0–10.5)
nRBC: 0 % (ref 0.0–0.2)

## 2018-11-16 NOTE — ED Triage Notes (Addendum)
Pt says she noticed a bite to the back of her right hand 2-3 days ago; area now with redness and swelling to area and moving up above wrist; area warm to touch; pt says her joints in her right hand are aching

## 2018-11-17 ENCOUNTER — Emergency Department
Admission: EM | Admit: 2018-11-17 | Discharge: 2018-11-17 | Disposition: A | Discharge status: Home / Self Care | Payer: Self-pay | Attending: Emergency Medicine | Admitting: Emergency Medicine

## 2018-11-17 NOTE — ED Notes (Signed)
Patient called, no answer. Patient not seen in lobby, bathroom, or outside.  

## 2018-11-17 NOTE — ED Notes (Signed)
Called no answer. Not seen in lobby or bathroom

## 2019-04-18 ENCOUNTER — Ambulatory Visit: Payer: Medicaid Other | Attending: Internal Medicine

## 2019-04-18 DIAGNOSIS — Z20822 Contact with and (suspected) exposure to covid-19: Secondary | ICD-10-CM

## 2019-04-18 DIAGNOSIS — U071 COVID-19: Secondary | ICD-10-CM | POA: Insufficient documentation

## 2019-04-19 LAB — NOVEL CORONAVIRUS, NAA: SARS-CoV-2, NAA: DETECTED — AB

## 2019-06-02 DIAGNOSIS — B001 Herpesviral vesicular dermatitis: Secondary | ICD-10-CM | POA: Insufficient documentation

## 2019-07-16 ENCOUNTER — Emergency Department
Admission: EM | Admit: 2019-07-16 | Discharge: 2019-07-16 | Disposition: A | Discharge status: Home / Self Care | Payer: Medicaid Other | Attending: Emergency Medicine | Admitting: Emergency Medicine

## 2019-07-16 ENCOUNTER — Other Ambulatory Visit: Payer: Self-pay

## 2019-07-16 ENCOUNTER — Emergency Department: Payer: Medicaid Other

## 2019-07-16 ENCOUNTER — Encounter: Payer: Self-pay | Admitting: Emergency Medicine

## 2019-07-16 DIAGNOSIS — M79671 Pain in right foot: Secondary | ICD-10-CM | POA: Diagnosis not present

## 2019-07-16 MED ORDER — KETOROLAC TROMETHAMINE 10 MG PO TABS
10.0000 mg | ORAL_TABLET | Freq: Once | ORAL | Status: AC
  Administered 2019-07-16: 10 mg via ORAL
  Filled 2019-07-16: qty 1

## 2019-07-16 MED ORDER — IBUPROFEN 800 MG PO TABS: 800.0000 mg | ORAL_TABLET | Freq: Three times a day (TID) | ORAL | 0 refills | Status: AC | PRN

## 2019-07-16 NOTE — ED Triage Notes (Signed)
Pt reports she has had right foot pain x6 months and tonight pt stepped hard and the pain has increased. No obvious deformity.

## 2019-07-18 ENCOUNTER — Ambulatory Visit: Payer: Medicaid Other

## 2019-07-18 ENCOUNTER — Ambulatory Visit: Payer: Medicaid Other | Admitting: Podiatry

## 2019-07-18 ENCOUNTER — Encounter: Payer: Self-pay | Admitting: Podiatry

## 2019-07-18 ENCOUNTER — Other Ambulatory Visit: Payer: Self-pay

## 2019-07-18 VITALS — Temp 98.1°F

## 2019-07-18 DIAGNOSIS — M7751 Other enthesopathy of right foot: Secondary | ICD-10-CM | POA: Diagnosis not present

## 2019-07-18 MED ORDER — METHYLPREDNISOLONE 4 MG PO TBPK: ORAL_TABLET | ORAL | 0 refills | Status: DC

## 2019-07-18 MED ORDER — MELOXICAM 15 MG PO TABS: 15.0000 mg | ORAL_TABLET | Freq: Every day | ORAL | 1 refills | Status: DC

## 2019-07-21 NOTE — Progress Notes (Signed)
   HPI: 39 y.o. female presenting today as a new patient with a chief complaint of sharp, shooting, aching pain to th eright 1st MPJ that began about 6 months ago. Walking and flexing the toe increases the pain. She has been taking Ibuprofen and Tylenol for treatment. Patient is here for further evaluation and treatment.   Past Medical History:  Diagnosis Date  . Breast cancer ([place])    start chemo at [facility] on 12/06/15     Physical Exam: General: The patient is alert and oriented x3 in no acute distress.  Dermatology: Skin is warm, dry and supple bilateral lower extremities. Negative for open lesions or macerations.  Vascular: Palpable pedal pulses bilaterally. No edema or erythema noted. Capillary refill within normal limits.  Neurological: Epicritic and protective threshold grossly intact bilaterally.   Musculoskeletal Exam: Pain with palpation noted to the 1st MPJ of the right foot. Range of motion within normal limits to all pedal and ankle joints bilateral. Muscle strength 5/5 in all groups bilateral.   Radiographic Exam:  Absence of tibial sesamoid noted. Normal osseous mineralization. Joint spaces preserved. No fracture/dislocation/boney destruction.    Assessment: 1. 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 H/o tibial sesamoidectomy right    Plan of Care:  1. Patient evaluated. X-Rays reviewed.  2. Injection of 0.5 mLs Celestone Soluspan injected into the 1st MPJ of the right foot.  3. Prescription for Medrol Dose Pak provided to patient. 4. Prescription for Meloxicam provided to patient. 5. Post op shoe dispensed.  6. Return to clinic in 4 weeks.       Edrick Kins, DPM [facility]  Dr. Edrick Kins, DPM    [address], [place] [address]                Office [phone]  Fax [phone]

## 2019-07-23 NOTE — ED Provider Notes (Signed)
[facility] Emergency Department Provider Note  I have reviewed the triage vital signs and the nursing notes.   HISTORY  Chief Complaint Foot Pain    HPI Carly Davis is a 39 y.o. female presents to the emergency department secondary to nontraumatic right foot pain x6 months with acute worsening tonight.  Patient states that she "stepped hard" and immediately started experiencing considerable pain at the base of the right great toe.  Current pain score 7 out of 10.  Patient denies any history of gout.        Past Medical History:  Diagnosis Date  . Breast cancer ([place])    start chemo at [facility] on 12/06/15    Patient Active Problem List   Diagnosis Date Noted  . 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 Migraines 04/30/2017  . BRCA1 positive 02/05/2017  . Acquired absence of breast, bilateral 07/10/2016  . History of breast reconstruction 07/10/2016  . Malignant neoplasm of upper-inner quadrant of right female breast ([place]) 11/24/2015  . Anxiety 06/09/2014  . Tension-type headache, not intractable 06/09/2014    Past Surgical History:  Procedure Laterality Date  . ABDOMINAL HYSTERECTOMY    . MASTECTOMY    . TONSILLECTOMY      Prior to Admission medications   Medication Sig Start Date End Date Taking? Authorizing Provider  acetaminophen (TYLENOL) 650 MG CR tablet Take by mouth. 08/07/18   [provider]  anastrozole (ARIMIDEX) 1 MG tablet Take by mouth. 02/11/18   [provider]  furosemide (LASIX) 20 MG tablet Take 20 mg by mouth. 03/13/16 03/13/17  [provider]  ibuprofen (ADVIL) 800 MG tablet Take 1 tablet (800 mg total) by mouth every 8 (eight) hours as needed. 07/16/19   Gregor Hams, MD  LORazepam (ATIVAN) 0.5 MG tablet Take 0.5 mg by mouth. 10/31/16   [provider]  meloxicam (MOBIC) 15 MG tablet Take 1 tablet (15 mg total) by mouth daily. 07/18/19   Edrick Kins, DPM  methylPREDNISolone (MEDROL DOSEPAK)  4 MG TBPK tablet 6 day dose pack - take as directed 07/18/19   Edrick Kins, DPM  Multiple Vitamin (MULTIVITAMIN) capsule Take by mouth.    [provider]  ondansetron (ZOFRAN) 4 MG tablet Take 1 tablet (4 mg total) by mouth every 8 (eight) hours as needed for nausea or vomiting. 02/18/18   Lisa Roca, MD  phentermine 15 MG capsule Take by mouth. 06/08/19 06/07/20  [provider]  rizatriptan (MAXALT) 10 MG tablet Take 10 mg by mouth. 12/17/16   [provider]  topiramate (TOPAMAX) 50 MG tablet Take by mouth. 03/17/19 03/16/20  [provider]  venlafaxine XR (EFFEXOR-XR) 150 MG 24 hr capsule Take by mouth. 01/04/19   [provider]    Allergies Amoxicillin and Penicillins  History reviewed. No pertinent family history.  Social History Social History   Tobacco Use  . Smoking status: Never Smoker  . Smokeless tobacco: Never Used  Substance Use Topics  . Alcohol use: No  . Drug use: No    Review of Systems Constitutional: No fever/chills Eyes: No visual changes. ENT: No sore throat. Cardiovascular: Denies chest pain. Respiratory: Denies shortness of breath. Gastrointestinal: No abdominal pain.  No nausea, no vomiting.  No diarrhea.  No constipation. Genitourinary: Negative for dysuria. Musculoskeletal: Negative for neck pain.  Negative for back pain.  Positive for right foot pain Integumentary: Negative for rash. Neurological: Negative for headaches, focal weakness or numbness. Psychiatric:  ____________________________________________   PHYSICAL EXAM:  VITAL SIGNS: ED Triage Vitals  Enc Vitals Group     BP 07/16/19 0032 125/76     Pulse Rate 07/16/19 0032 77     Resp 07/16/19 0032 18     Temp 07/16/19 0032 98.3 F (36.8 C)     Temp Source 07/16/19 0032 Oral     SpO2 07/16/19 0032 100 %     Weight --      Height --      Head Circumference --      Peak Flow --      Pain Score 07/16/19 0235 1     Pain Loc --       Pain Edu? --      Excl. in [place]? --     Constitutional: Alert and oriented.  Eyes: Conjunctivae are normal.  Mouth/Throat: Patient is wearing a mask. Neck: No stridor.  No meningeal signs.   Cardiovascular: Normal rate, regular rhythm. Good peripheral circulation. Grossly normal heart sounds. Respiratory: Normal respiratory effort.  No retractions. Gastrointestinal: Soft and nontender. No distention.  Musculoskeletal: Pain with palpation right great toe metatarsophalangeal joint.  Pain with palpation of the calcaneal spur Neurologic:  Normal speech and language. No gross focal neurologic deficits are appreciated.  Skin:  Skin is warm, dry and intact. Psychiatric: Mood and affect are normal. Speech and behavior are normal.  ___________ ____________________________________________  RADIOLOGY I, Gregor Hams, personally viewed and evaluated these images (plain radiographs) as part of my medical decision making, as well as reviewing the written report by the radiologist.  ED MD interpretation: No acute findings noted on right foot x-ray per radiologist  Official radiology report(s): No results found.    Procedures   ____________________________________________   INITIAL IMPRESSION / MDM / ASSESSMENT AND PLAN / ED COURSE  As part of my medical decision making, I reviewed the following data within the electronic MEDICAL RECORD NUMBER   39 year old female presented with above-stated history and physical exam secondary to right foot pain.  Consider the possibility of stress fracture gout or plantar fasciitis as potential etiology for the patient's discomfort.  X-ray revealed no evidence of fracture or dislocation physical exam findings not consistent with gout.  I spoke with the patient at length regarding necessity of following up with podiatry to which she has an appointment. ____________________________________________  FINAL CLINICAL IMPRESSION(S) / ED DIAGNOSES  Final diagnoses:   Foot pain, right     MEDICATIONS GIVEN DURING THIS VISIT:  Medications  ketorolac (TORADOL) tablet 10 mg (10 mg Oral Given 07/16/19 0232)     ED Discharge Orders         Ordered    ibuprofen (ADVIL) 800 MG tablet  Every 8 hours PRN     07/16/19 0215          *Please note:  Carly Davis was evaluated in Emergency Department on 07/23/2019 for the symptoms described in the history of present illness. She was evaluated in the context of the global COVID-19 pandemic, which necessitated consideration that the patient might be at risk for infection with the SARS-CoV-2 virus that causes COVID-19. Institutional protocols and algorithms that pertain to the evaluation of patients at risk for COVID-19 are in a state of rapid change based on information released by regulatory bodies including the CDC and federal and state organizations. These policies and algorithms were followed during the patient's care in the ED.  Some ED evaluations and interventions may be delayed as a  result of limited staffing during the pandemic.*  Note:  This document was prepared using Dragon voice recognition software and may include unintentional dictation errors.   Gregor Hams, MD 07/23/19 2231

## 2019-08-15 ENCOUNTER — Ambulatory Visit: Payer: Medicaid Other | Admitting: Podiatry

## 2019-09-16 ENCOUNTER — Ambulatory Visit: Payer: Medicaid Other | Admitting: Podiatry

## 2019-09-16 ENCOUNTER — Other Ambulatory Visit: Payer: Self-pay

## 2019-09-16 ENCOUNTER — Encounter: Payer: Self-pay | Admitting: Podiatry

## 2019-09-16 DIAGNOSIS — M7751 Other enthesopathy of right foot: Secondary | ICD-10-CM | POA: Diagnosis not present

## 2019-09-16 NOTE — Progress Notes (Signed)
   HPI: 39 y.o. female presenting today for follow-up evaluation regarding first MTPJ capsulitis to the right foot.  Patient states that the injection and the prednisone pack helped very temporarily as well as wearing the surgical shoe.  As soon as she got out of the surgical shoe after wearing it for a month the pain immediately recurred.  She is concerned for possible fracture.  She does have history of tibial sesamoidectomy.  The patient has now had the symptoms for greater than 6 months.  She presents for further treatment evaluation  Past Medical History:  Diagnosis Date  . Breast cancer ([place])    start chemo at [facility] on 12/06/15     Physical Exam: General: The patient is alert and oriented x3 in no acute distress.  Dermatology: Skin is warm, dry and supple bilateral lower extremities. Negative for open lesions or macerations.  Vascular: Palpable pedal pulses bilaterally. No edema or erythema noted. Capillary refill within normal limits.  Neurological: Epicritic and protective threshold grossly intact bilaterally.   Musculoskeletal Exam: Pain with palpation noted to the 1st MPJ of the right foot. Range of motion within normal limits to all pedal and ankle joints bilateral. Muscle strength 5/5 in all groups bilateral.   Assessment: 1. 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 H/o tibial sesamoidectomy right    Plan of Care:  1. Patient evaluated.   2.  Today we will order MRI right forefoot without contrast.  Patient has been dealing with the symptoms for greater than 6 months despite conservative treatment modalities.  Medically necessary. 3.  Return to clinic after MRI to review the results and discuss further treatment options 4.  Continue taking meloxicam daily      Edrick Kins, DPM [facility]  Dr. Edrick Kins, DPM    [address], [place] [address]                Office [phone]  Fax [phone]

## 2019-09-19 ENCOUNTER — Telehealth: Payer: Self-pay

## 2019-09-19 DIAGNOSIS — M7751 Other enthesopathy of right foot: Secondary | ICD-10-CM

## 2019-09-19 NOTE — Telephone Encounter (Signed)
Today we will order MRI right forefoot without contrast  Office notes have been sent to [facility] for review

## 2019-09-22 NOTE — Telephone Encounter (Signed)
MRI has been approved from 09/19/2019 to 03/17/2020 Auth# [id number]  Patient has been notified and will contact scheduling dept to set up MRI appt to her convenience

## 2019-09-22 NOTE — Addendum Note (Signed)
Addended by: Graceann Congress D on: 09/22/2019 01:57 PM   Modules accepted: Orders

## 2019-10-02 ENCOUNTER — Ambulatory Visit
Admission: RE | Admit: 2019-10-02 | Discharge: 2019-10-02 | Disposition: A | Discharge status: Home / Self Care | Payer: Medicaid Other | Source: Ambulatory Visit | Attending: Podiatry | Admitting: Podiatry

## 2019-10-02 ENCOUNTER — Other Ambulatory Visit: Payer: Self-pay

## 2019-10-02 DIAGNOSIS — M7751 Other enthesopathy of right foot: Secondary | ICD-10-CM | POA: Diagnosis not present

## 2019-10-08 ENCOUNTER — Ambulatory Visit: Payer: Medicaid Other | Admitting: Podiatry

## 2019-10-10 ENCOUNTER — Other Ambulatory Visit: Payer: Self-pay

## 2019-10-10 ENCOUNTER — Encounter: Payer: Self-pay | Admitting: Podiatry

## 2019-10-10 ENCOUNTER — Ambulatory Visit (INDEPENDENT_AMBULATORY_CARE_PROVIDER_SITE_OTHER): Payer: Medicaid Other | Admitting: Podiatry

## 2019-10-10 DIAGNOSIS — M7751 Other enthesopathy of right foot: Secondary | ICD-10-CM | POA: Diagnosis not present

## 2019-10-10 DIAGNOSIS — M2580 Other specified joint disorders, unspecified joint: Secondary | ICD-10-CM

## 2019-10-10 MED ORDER — METHYLPREDNISOLONE 4 MG PO TBPK: ORAL_TABLET | ORAL | 0 refills | Status: DC

## 2019-10-10 NOTE — Progress Notes (Signed)
   HPI: 39 y.o. female presenting today for follow-up evaluation regarding first MTPJ capsulitis to the right foot.  Patient states she has been dealing with the pain now for approximately 9 months.  No change since last visit.  Past Medical History:  Diagnosis Date  . Breast cancer ([place])    start chemo at [facility] on 12/06/15     Physical Exam: General: The patient is alert and oriented x3 in no acute distress.  Dermatology: Skin is warm, dry and supple bilateral lower extremities. Negative for open lesions or macerations.  Vascular: Palpable pedal pulses bilaterally. No edema or erythema noted. Capillary refill within normal limits.  Neurological: Epicritic and protective threshold grossly intact bilaterally.   Musculoskeletal Exam: Pain with palpation noted to the 1st MPJ of the right foot.  There is also pain to the fibular sesamoid right foot.  Range of motion within normal limits to all pedal and ankle joints bilateral. Muscle strength 5/5 in all groups bilateral.   MRI impression 10/03/2019: Marrow edema in the lateral sesamoid is likely due to degenerative disease and overlying first metatarsal head. The medial sesamoid is not identified and likely has been resected given the patient's history of surgery. The study is otherwise normal.  Assessment: 1. 1st MPJ capsulitis right-dorsal 2.  Fibular sesamoiditis right  3.  H/o tibial sesamoidectomy right    Plan of Care:  1. Patient evaluated.   2.  Today we are going to pursue aggressive conservative treatment.  Patient needs to be weightbearing in a cam boot x6 weeks 3.  Prescription for Medrol Dosepak, then resume meloxicam daily 4.  Return to clinic in 6 weeks to initiate possible physical therapy to transition out of the cam boot into tennis shoes.  Also for follow-up x-rays     Edrick Kins, DPM [facility]  Dr. Edrick Kins, DPM    [address],  [place] [address]                Office [phone]  Fax [phone]

## 2019-11-21 ENCOUNTER — Ambulatory Visit (INDEPENDENT_AMBULATORY_CARE_PROVIDER_SITE_OTHER): Payer: Medicaid Other

## 2019-11-21 ENCOUNTER — Other Ambulatory Visit: Payer: Self-pay

## 2019-11-21 ENCOUNTER — Ambulatory Visit: Payer: Medicaid Other | Admitting: Podiatry

## 2019-11-21 DIAGNOSIS — M7751 Other enthesopathy of right foot: Secondary | ICD-10-CM

## 2019-11-21 DIAGNOSIS — M2580 Other specified joint disorders, unspecified joint: Secondary | ICD-10-CM

## 2019-11-21 NOTE — Progress Notes (Signed)
   HPI: 39 y.o. female presenting today for follow-up evaluation regarding first MTPJ capsulitis/sesamoiditis to the right foot.  Patient has history of tibial sesamoidectomy around 2010.  She has now been dealing with great toe pain for the last 10 months or so.  She has worn the cam boot since last visit with relief however every time she tries to get out of the cam boot she begins to experience pain.  She presents for further treatment and evaluation  Past Medical History:  Diagnosis Date  . Breast cancer ([place])    start chemo at [facility] on 12/06/15     Physical Exam: General: The patient is alert and oriented x3 in no acute distress.  Dermatology: Skin is warm, dry and supple bilateral lower extremities. Negative for open lesions or macerations.  Vascular: Palpable pedal pulses bilaterally. No edema or erythema noted. Capillary refill within normal limits.  Neurological: Epicritic and protective threshold grossly intact bilaterally.   Musculoskeletal Exam: Pain with palpation noted to the 1st MPJ of the right foot.  There is also pain to the fibular sesamoid right foot.  Range of motion within normal limits to all pedal and ankle joints bilateral. Muscle strength 5/5 in all groups bilateral.   MRI impression 10/03/2019: Marrow edema in the lateral sesamoid is likely due to degenerative disease and overlying first metatarsal head. The medial sesamoid is not identified and likely has been resected given the patient's history of surgery. The study is otherwise normal.  Assessment: 1. 1st MPJ capsulitis right-dorsal 2.  Fibular sesamoiditis right  3.  H/o tibial sesamoidectomy right    Plan of Care:  1. Patient evaluated.   2.  Patient may discontinue cam boot.  Recommend good supportive shoes that do not aggravate the great toe joint or fibular sesamoid 3.  Appointment with Pedorthist for custom molded orthotics with the first MTPJ offload 4.  Today we did discuss the possibility of  surgery.  The patient has been dealing with this pain coming up on a year now.  Surgery would need to consist of fibular sesamoidectomy with first MTPJ arthrodesis.  The patient is very young and I would like to reserve surgical intervention as a last resort. 5.  Continue meloxicam daily as needed  6.  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  Recently she has been getting into refinishing cabinets.   Edrick Kins, DPM [facility]  Dr. Edrick Kins, DPM    [address], [place] [address]                Office [phone]  Fax [phone]

## 2019-12-17 ENCOUNTER — Ambulatory Visit (INDEPENDENT_AMBULATORY_CARE_PROVIDER_SITE_OTHER): Payer: Medicaid Other | Admitting: Orthotics

## 2019-12-17 ENCOUNTER — Other Ambulatory Visit: Payer: Self-pay

## 2019-12-17 DIAGNOSIS — M7751 Other enthesopathy of right foot: Secondary | ICD-10-CM

## 2019-12-17 DIAGNOSIS — M7752 Other enthesopathy of left foot: Secondary | ICD-10-CM | POA: Diagnosis not present

## 2019-12-17 DIAGNOSIS — M2580 Other specified joint disorders, unspecified joint: Secondary | ICD-10-CM

## 2019-12-17 NOTE — Progress Notes (Signed)
Cast today for CMFO to address sesmoiditis/pain first mpj R.  Plan on device hug arch/k-wedge RT.  Patient advised 438 charge and insurance ([employer] [place]) wont' pay.

## 2020-01-14 ENCOUNTER — Encounter: Payer: Medicaid Other | Admitting: Orthotics

## 2020-01-28 ENCOUNTER — Ambulatory Visit: Payer: Medicaid Other | Admitting: Orthotics

## 2020-01-28 ENCOUNTER — Other Ambulatory Visit: Payer: Self-pay

## 2020-01-28 DIAGNOSIS — M7751 Other enthesopathy of right foot: Secondary | ICD-10-CM

## 2020-01-28 NOTE — Progress Notes (Signed)
Patient came in today to pick up custom made foot orthotics.  The goals were accomplished and the patient reported no dissatisfaction with said orthotics.  Patient was advised of breakin period and how to report any issues. 

## 2020-02-27 ENCOUNTER — Ambulatory Visit: Payer: Medicaid Other | Admitting: Podiatry

## 2020-07-27 ENCOUNTER — Ambulatory Visit (LOCAL_COMMUNITY_HEALTH_CENTER): Payer: Self-pay

## 2020-07-27 ENCOUNTER — Other Ambulatory Visit: Payer: Self-pay

## 2020-07-27 DIAGNOSIS — Z0184 Encounter for antibody response examination: Secondary | ICD-10-CM

## 2020-07-27 NOTE — Progress Notes (Signed)
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 Pt cannot remember if she has had Hep B vaccine and does not have vaccine documentation with her. NCIR review shows one dose flu vaccine, no hep b vaccine. Epic review shows no hep b vaccine.  Employer to pay for Hep B titer. ROI signed. Sent to lab for quantitative Hep B titer. RN to call when results ready. Josie Saunders, RN

## 2020-07-28 ENCOUNTER — Telehealth: Payer: Self-pay

## 2020-07-28 LAB — HEPATITIS B SURFACE ANTIBODY, QUANTITATIVE: Hepatitis B Surf Ab Quant: <3.1 m[IU]/mL — ABNORMAL LOW (ref >9.9)

## 2020-07-28 NOTE — Telephone Encounter (Signed)
Hepatitis B titer results obtained yesterday are inconsistent with immunity. Call to client with results and she requested immunization appt for next Wednesday. Appt scheduled for 0820 and client aware to arrive at 0800. Per client, employer will be paying for vaccine and aware to bring form indicating this. Counseled regarding Hepatitis B vaccine availability and cost for each. Client states will retrieve copy of titer results at appt. Rich Number, RN

## 2020-08-04 ENCOUNTER — Ambulatory Visit: Payer: Self-pay

## 2020-09-20 ENCOUNTER — Ambulatory Visit: Payer: Medicaid Other

## 2021-09-20 ENCOUNTER — Encounter: Payer: Self-pay | Admitting: Podiatry

## 2021-09-20 ENCOUNTER — Ambulatory Visit: Payer: Medicaid Other

## 2021-09-20 ENCOUNTER — Ambulatory Visit: Payer: Medicaid Other | Admitting: Podiatry

## 2021-09-20 DIAGNOSIS — M2041 Other hammer toe(s) (acquired), right foot: Secondary | ICD-10-CM

## 2021-09-20 DIAGNOSIS — M25871 Other specified joint disorders, right ankle and foot: Secondary | ICD-10-CM

## 2021-09-20 MED ORDER — MELOXICAM 15 MG PO TABS: 15.0000 mg | ORAL_TABLET | Freq: Every day | ORAL | 1 refills | Status: AC

## 2021-09-20 MED ORDER — METHYLPREDNISOLONE 4 MG PO TBPK
ORAL_TABLET | ORAL | 0 refills | Status: DC
Start: 2021-09-20 — End: 2021-11-29

## 2021-09-20 MED ORDER — BETAMETHASONE SOD PHOS & ACET 6 (3-3) MG/ML IJ SUSP
3.0000 mg | Freq: Once | INTRAMUSCULAR | Status: AC
  Administered 2021-09-20: 3 mg via INTRA_ARTICULAR

## 2021-09-20 NOTE — Progress Notes (Signed)
   HPI: 41 y.o. female presenting today for follow-up evaluation regarding first MTPJ capsulitis/sesamoiditis to the right foot.  Patient has history of tibial sesamoidectomy around 2010.  Patient states that recently she was doing well until she got a new job and she began walking on concrete floors throughout the day.  She says she walks approximately 5 miles per day during work.  She has noticed increased pain and swelling.  She is not interested in surgery at the moment.  She presents for further treatment and evaluation  Past Medical History:  Diagnosis Date   Breast cancer ([place])    start chemo at [facility] on 12/06/15   Past Surgical History:  Procedure Laterality Date   ABDOMINAL HYSTERECTOMY     MASTECTOMY     TONSILLECTOMY     Allergies  Allergen Reactions   Codeine Hives   Amoxicillin Hives   Penicillins Hives    Physical Exam: General: The patient is alert and oriented x3 in no acute distress.  Dermatology: Skin is warm, dry and supple bilateral lower extremities. Negative for open lesions or macerations.  Vascular: Palpable pedal pulses bilaterally. No edema or erythema noted. Capillary refill within normal limits.  Neurological: Epicritic and protective threshold grossly intact bilaterally.   Musculoskeletal Exam: Pain with palpation noted to the 1st MPJ of the right foot.  There is also pain to the fibular sesamoid right foot.  Range of motion within normal limits to all pedal and ankle joints bilateral. Muscle strength 5/5 in all groups bilateral.   MRI impression 10/03/2019: Marrow edema in the lateral sesamoid is likely due to degenerative disease and overlying first metatarsal head. The medial sesamoid is not identified and likely has been resected given the patient's history of surgery. The study is otherwise normal.  Assessment: 1. 1st MPJ capsulitis right-dorsal 2.  Fibular sesamoiditis right  3.  H/o tibial sesamoidectomy right    Plan of Care:  1. Patient  evaluated.  Patient does not want surgery and we are going to continue to pursue conservative treatment for now 2.  Cam boot dispensed.  Wear daily x3 weeks 3.  Injection of 0.5 cc Celestone Soluspan injected into the sesamoidal apparatus right foot 4.  Prescription for Medrol Dosepak 5.  Prescription for meloxicam 15 mg daily 6.  Offloading felt dancers pads provided to apply to the insoles of the shoes to offload pressure from the great toe joint 7.  Recommend good supportive thick cushioned rocker-bottom running type shoes 8.  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   Edrick Kins, DPM [facility]  Dr. Edrick Kins, DPM    [address], [place] [address]                Office [phone]  Fax [phone]

## 2021-11-15 ENCOUNTER — Telehealth: Payer: Self-pay | Admitting: Podiatry

## 2021-11-15 NOTE — Telephone Encounter (Signed)
That is totally fine.  She can come in for an injection.  Please reach out to the patient and schedule an appointment.  Thanks, Dr. Amalia Hailey

## 2021-11-15 NOTE — Telephone Encounter (Signed)
Pt wants to know how often she can get an injection in her heel. Her last injection was on 09/20/21. She states she wore the wrong shoes (flip flops) over the weekend to a concert and now her foot is in pain. She wasn't sure if she can come in for another injection and wear her cam boot for a while until it stops hurting.  Please advise

## 2021-11-16 NOTE — Telephone Encounter (Signed)
Pt scheduled to see Dr Amalia Hailey in [place] on 8.15 and is on waitlist if anything cancels for friday

## 2021-11-22 ENCOUNTER — Ambulatory Visit: Payer: Medicaid Other | Admitting: Podiatry

## 2021-11-29 ENCOUNTER — Ambulatory Visit: Payer: Medicaid Other | Admitting: Podiatry

## 2021-11-29 DIAGNOSIS — M25871 Other specified joint disorders, right ankle and foot: Secondary | ICD-10-CM

## 2021-11-29 MED ORDER — METHYLPREDNISOLONE 4 MG PO TBPK: ORAL_TABLET | ORAL | 0 refills | Status: AC

## 2021-11-29 MED ORDER — BETAMETHASONE SOD PHOS & ACET 6 (3-3) MG/ML IJ SUSP
3.0000 mg | Freq: Once | INTRAMUSCULAR | Status: AC
  Administered 2021-11-29: 3 mg via INTRA_ARTICULAR

## 2021-11-29 NOTE — Progress Notes (Signed)
   Chief Complaint  Patient presents with   Foot Pain    Right foot pain.    HPI: 41 y.o. female presenting today for follow-up evaluation regarding first MTPJ capsulitis/sesamoiditis to the right foot.  Patient has history of tibial sesamoidectomy around 2010.  Patient states that she was doing great until a few weeks ago when she wore sandals at a race kart event.  Patient states that she was on her feet for the entire day and began to notice pain and tenderness to the sesamoidal area.  She did very well last visit with an injection and prednisone pack.  She continues to take meloxicam as needed.  Today she presents wearing a cam boot.  Past Medical History:  Diagnosis Date   Breast cancer ([place])    start chemo at [facility] on 12/06/15   Past Surgical History:  Procedure Laterality Date   ABDOMINAL HYSTERECTOMY     MASTECTOMY     TONSILLECTOMY     Allergies  Allergen Reactions   Codeine Hives   Amoxicillin Hives   Penicillins Hives    Physical Exam: General: The patient is alert and oriented x3 in no acute distress.  Dermatology: Skin is warm, dry and supple bilateral lower extremities. Negative for open lesions or macerations.  Vascular: Palpable pedal pulses bilaterally. No edema or erythema noted. Capillary refill within normal limits.  Neurological: Epicritic and protective threshold grossly intact bilaterally.   Musculoskeletal Exam: Pain with palpation noted to the 1st MPJ of the right foot.  There is also pain to the fibular sesamoid right foot.  Range of motion within normal limits to all pedal and ankle joints bilateral. Muscle strength 5/5 in all groups bilateral.   MRI impression 10/03/2019: Marrow edema in the lateral sesamoid is likely due to degenerative disease and overlying first metatarsal head. The medial sesamoid is not identified and likely has been resected given the patient's history of surgery. The study is otherwise normal.  Assessment: 1. 1st MPJ  capsulitis right-dorsal 2.  Fibular sesamoiditis right  3.  H/o tibial sesamoidectomy right    Plan of Care:  1. Patient evaluated.  Patient does not want surgery and we are going to continue to pursue conservative treatment for now 2.  Continue cam boot.  Wear daily x3 weeks 3.  Injection of 0.5 cc Celestone Soluspan injected into the sesamoidal apparatus right foot 4.  Prescription for Medrol Dosepak 5.  Continue meloxicam 15 mg daily as needed 6.  Continue offloading felt dancers pads provided to apply to the insoles of the shoes to offload pressure from the great toe joint 7.  Recommend good supportive thick cushioned rocker-bottom running type shoes 8.  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   Edrick Kins, DPM [facility]  Dr. Edrick Kins, DPM    [address], [place] [address]                Office [phone]  Fax [phone]

## 2023-04-18 ENCOUNTER — Ambulatory Visit: Payer: Medicaid Other | Admitting: Podiatry

## 2023-04-24 ENCOUNTER — Ambulatory Visit (INDEPENDENT_AMBULATORY_CARE_PROVIDER_SITE_OTHER): Payer: Medicaid Other | Admitting: Podiatry

## 2023-04-24 ENCOUNTER — Encounter: Payer: Self-pay | Admitting: Podiatry

## 2023-04-24 VITALS — Ht 65.0 in | Wt 170.0 lb

## 2023-04-24 DIAGNOSIS — M25871 Other specified joint disorders, right ankle and foot: Secondary | ICD-10-CM | POA: Diagnosis not present

## 2023-04-24 DIAGNOSIS — M7751 Other enthesopathy of right foot: Secondary | ICD-10-CM | POA: Diagnosis not present

## 2023-04-24 MED ORDER — BETAMETHASONE SOD PHOS & ACET 6 (3-3) MG/ML IJ SUSP
3.0000 mg | Freq: Once | INTRAMUSCULAR | Status: AC
  Administered 2023-04-24: 3 mg via INTRA_ARTICULAR

## 2023-04-24 NOTE — Progress Notes (Signed)
   Chief Complaint  Patient presents with   Toe Pain    right great toe pain( needs injection)also having foot pain on outer bottom side of foot    HPI: 43 y.o. female presenting today for follow-up evaluation regarding first MTPJ capsulitis/sesamoiditis to the right foot.  Patient has history of tibial sesamoidectomy around 2010.  Patient states that the foot has been chronically painful over the past several months however she has had medical condition and she has not been in a position to come in to have it evaluated.  Past Medical History:  Diagnosis Date   Breast cancer (HCC)    start chemo at [facility] on 12/06/15   Past Surgical History:  Procedure Laterality Date   ABDOMINAL HYSTERECTOMY     MASTECTOMY     TONSILLECTOMY     Allergies  Allergen Reactions   Codeine Hives   Amoxicillin Hives   Penicillins Hives    Physical Exam: General: The patient is alert and oriented x3 in no acute distress.  Dermatology: Skin is warm, dry and supple bilateral lower extremities. Negative for open lesions or macerations.  Vascular: Palpable pedal pulses bilaterally. No edema or erythema noted. Capillary refill within normal limits.  Neurological: Grossly intact via light touch  Musculoskeletal Exam: Pain with palpation noted to the 1st MPJ of the right foot.  There is also pain to the fibular sesamoid right foot.  Range of motion within normal limits to all pedal and ankle joints bilateral. Muscle strength 5/5 in all groups bilateral.   MRI impression 10/03/2019: Marrow edema in the lateral sesamoid is likely due to degenerative disease and overlying first metatarsal head. The medial sesamoid is not identified and likely has been resected given the patient's history of surgery. The study is otherwise normal.  Assessment: 1. 1st MPJ capsulitis right-dorsal 2.  Fibular sesamoiditis right  3.  H/o tibial sesamoidectomy right    Plan of Care:  -Patient evaluated.   -CAM boot dispensed.   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   Thresa EMERSON Sar, DPM [facility]  Dr. Thresa EMERSON Sar, DPM    [address] [place], [place] [address]                Office [phone]  Fax [phone]
